# Patient Record
Sex: Female | Born: 2000 | Race: Black or African American | Hispanic: No | Marital: Single | Smoking: Never smoker
Health system: Southern US, Community
[De-identification: ages and names within clinical notes are randomized; demographics above are authoritative.]

## PROBLEM LIST (undated history)

## (undated) ENCOUNTER — Inpatient Hospital Stay (HOSPITAL_COMMUNITY): Payer: Self-pay

## (undated) DIAGNOSIS — Z789 Other specified health status: Secondary | ICD-10-CM

## (undated) DIAGNOSIS — O99019 Anemia complicating pregnancy, unspecified trimester: Secondary | ICD-10-CM

## (undated) HISTORY — PX: NO PAST SURGERIES: SHX2092

---
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
## 2021-01-29 LAB — OB RESULTS CONSOLE GC/CHLAMYDIA
Chlamydia: NEGATIVE
Neisseria Gonorrhea: NEGATIVE

## 2021-01-29 LAB — OB RESULTS CONSOLE HEPATITIS B SURFACE ANTIGEN: Hepatitis B Surface Ag: NEGATIVE

## 2021-01-29 LAB — OB RESULTS CONSOLE ABO/RH: RH Type: POSITIVE

## 2021-01-29 LAB — OB RESULTS CONSOLE VARICELLA ZOSTER ANTIBODY, IGG: Varicella: NON-IMMUNE/NOT IMMUNE

## 2021-01-29 LAB — OB RESULTS CONSOLE RUBELLA ANTIBODY, IGM: Rubella: IMMUNE

## 2021-01-29 LAB — OB RESULTS CONSOLE ANTIBODY SCREEN: Antibody Screen: NEGATIVE

## 2021-01-29 LAB — HEPATITIS C ANTIBODY: HCV Ab: NEGATIVE

## 2021-05-02 IMAGING — MR MR PELVIS W/O CM
11 of 13 series · 35 of 48 positions shown · non-contrast
Comparison: 03/09/2020

CLINICAL DATA: Right lower quadrant pain, third trimester pregnancy

EXAM:
MRI ABDOMEN AND PELVIS WITHOUT CONTRAST
TECHNIQUE: Multiplanar multisequence MR imaging of the abdomen and pelvis was
performed. No intravenous contrast was administered.

[Series 4: cor haste · coronal · 5.0mm · 1.25mm/px · 2 of 44 slices shown]
[im 1/44]
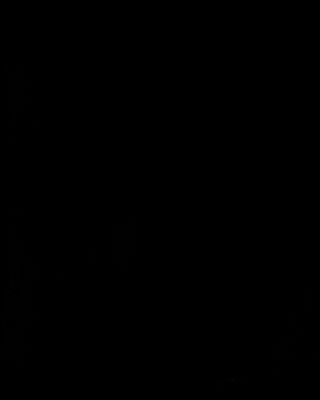
[im 44/44]
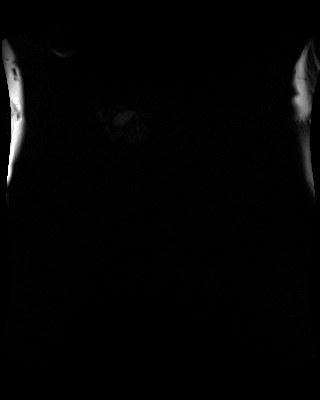

[Series 5: cor haste fs · coronal · 5.0mm · 1.25mm/px · 3 of 44 slices shown]
[im 1/44]
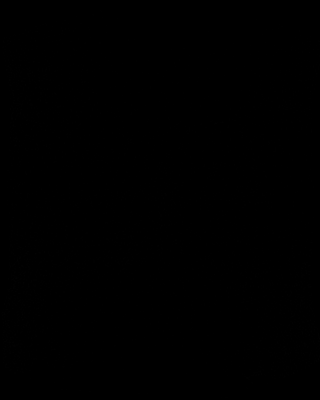
[im 22/44]
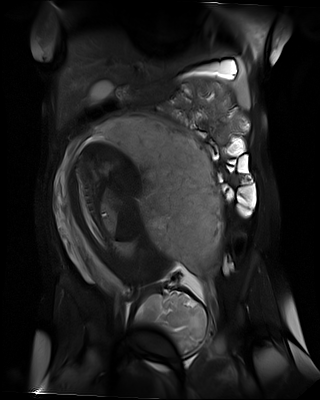
[im 44/44]
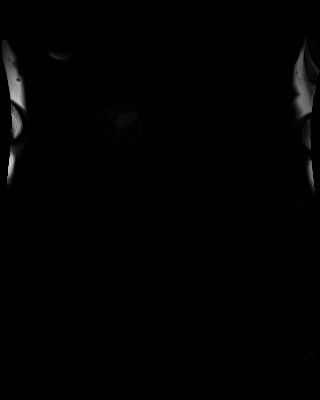

[Series 7: T2 · axial · 5.0mm · 1.03mm/px · z∈[-36,+240]mm · 3 of 47 slices shown (1 of 2)]
[im 1/47]
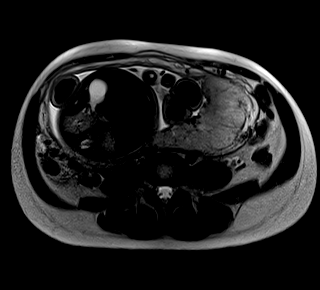
[im 24/47]
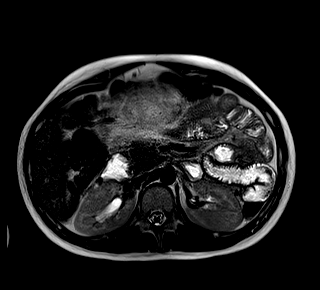
[im 47/47]
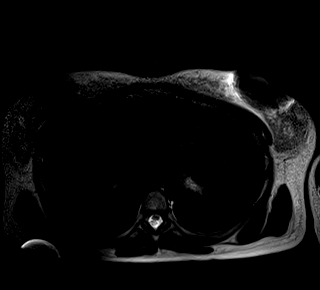

[Series 8: T2 fat-sat · axial · 5.0mm · 1.03mm/px · z∈[-36,+240]mm · 3 of 47 slices shown (1 of 2)]
[im 1/47]
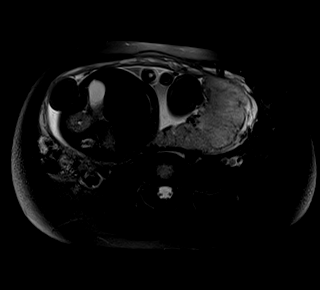
[im 24/47]
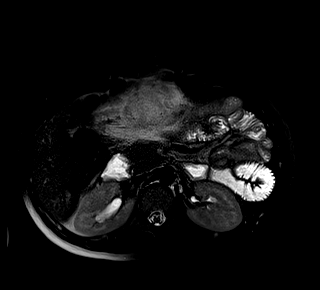
[im 47/47]
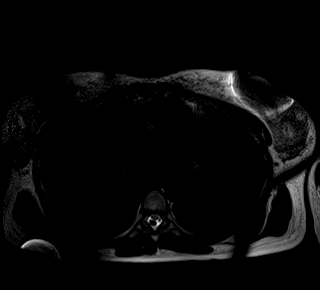

[Series 9: T2 · axial · 4.0mm · 1.00mm/px · z∈[-252,+16]mm · 3 of 57 slices shown (2 of 2)]
[im 1/57]
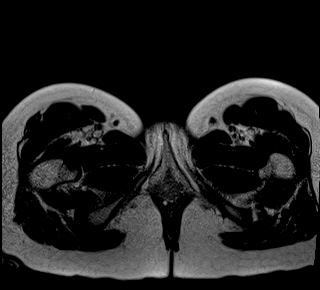
[im 29/57]
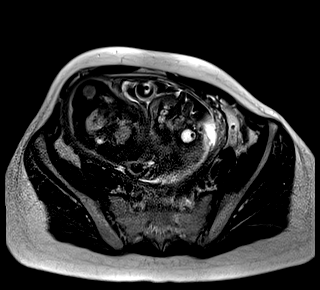
[im 57/57]
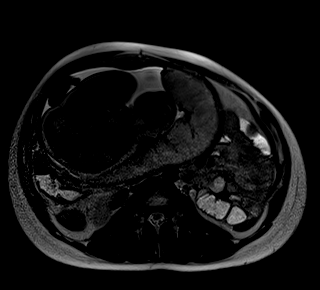

[Series 10: T2 fat-sat · axial · 4.0mm · 1.00mm/px · z∈[-252,+16]mm · 3 of 57 slices shown (2 of 2)]
[im 1/57]
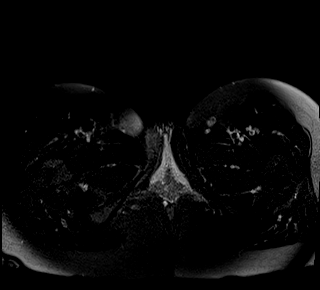
[im 29/57]
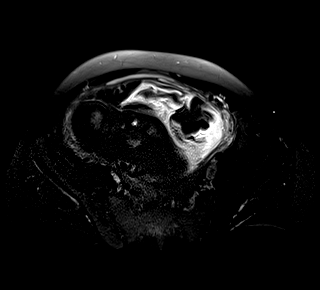
[im 57/57]
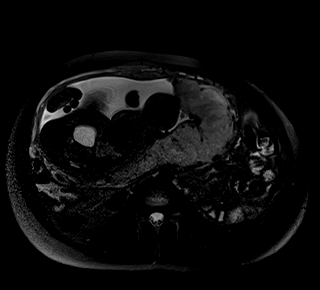

[Series 11: bSSFP · axial · 4.0mm · 0.62mm/px · z∈[-259,+9]mm · 3 of 55 slices shown (1 of 3)]
[im 1/55]
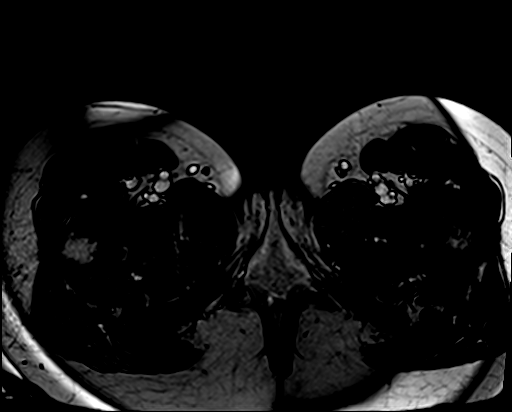
[im 28/55]
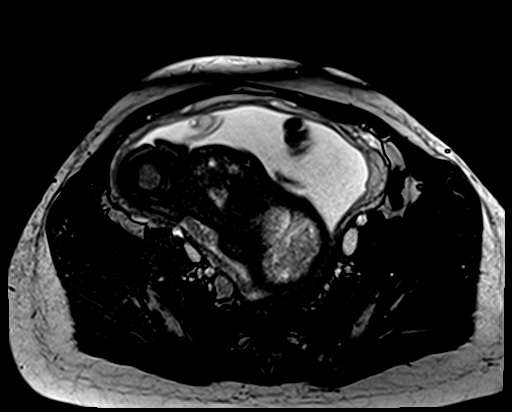
[im 55/55]
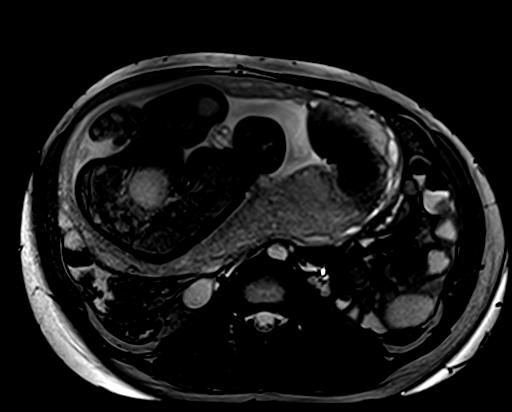

[Series 12: bSSFP · axial · 4.0mm · 0.62mm/px · z∈[-14,+245]mm · 3 of 55 slices shown (2 of 3)]
[im 1/55]
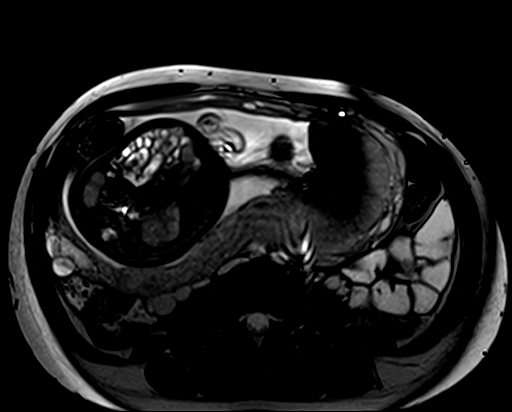
[im 28/55]
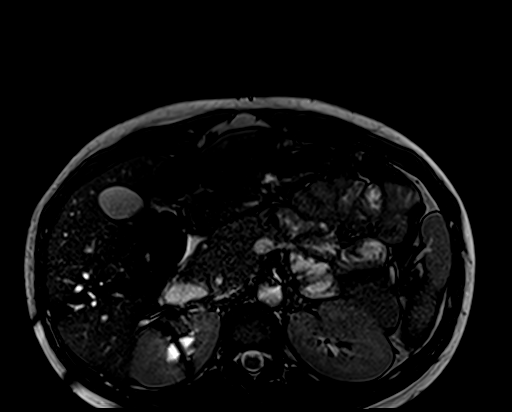
[im 55/55]
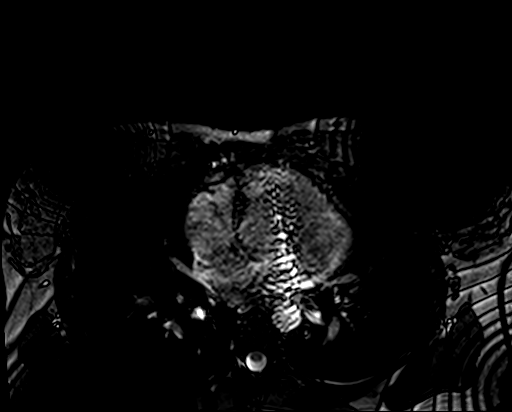

[Series 13: T1 dynamic · axial · 3.0mm · 1.19mm/px · z∈[+2,+263]mm · 5 of 88 slices shown (1 of 2)]
[im 1/88]
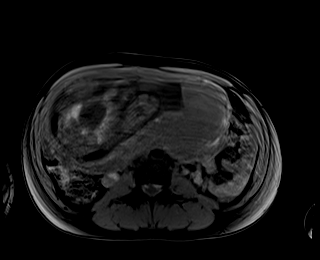
[im 22/88]
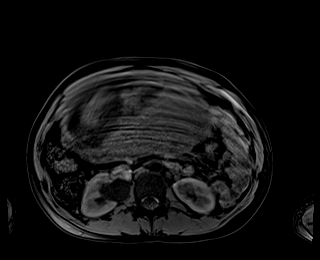
[im 44/88]
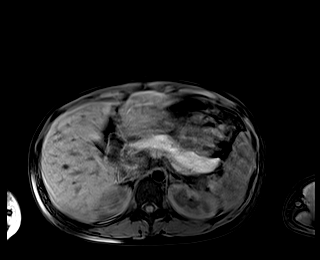
[im 66/88]
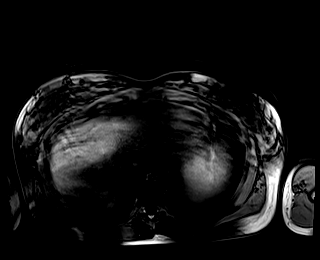
[im 88/88]
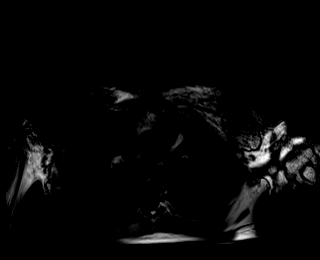

[Series 14: T1 dynamic · axial · 3.0mm · 1.19mm/px · z∈[-266,+18]mm · 6 of 96 slices shown (2 of 2)]
[im 1/96]
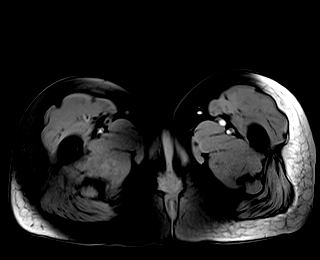
[im 20/96]
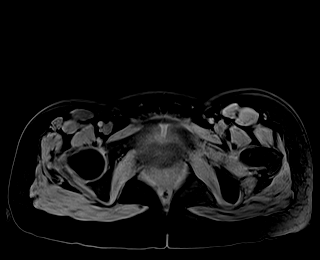
[im 39/96]
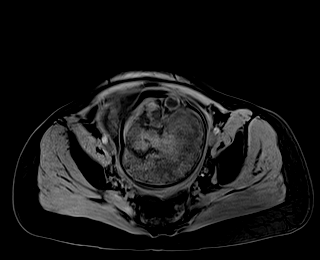
[im 58/96]
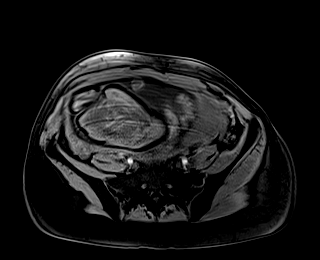
[im 77/96]
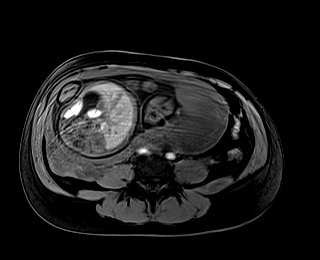
[im 96/96]
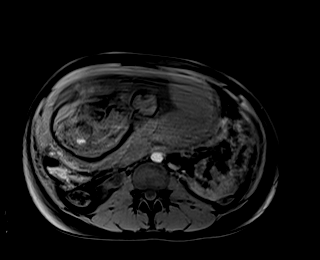

[Series 15: bSSFP · coronal · 5.0mm · 0.78mm/px · 1 of 44 slices shown (3 of 3)]
[im 1/44]
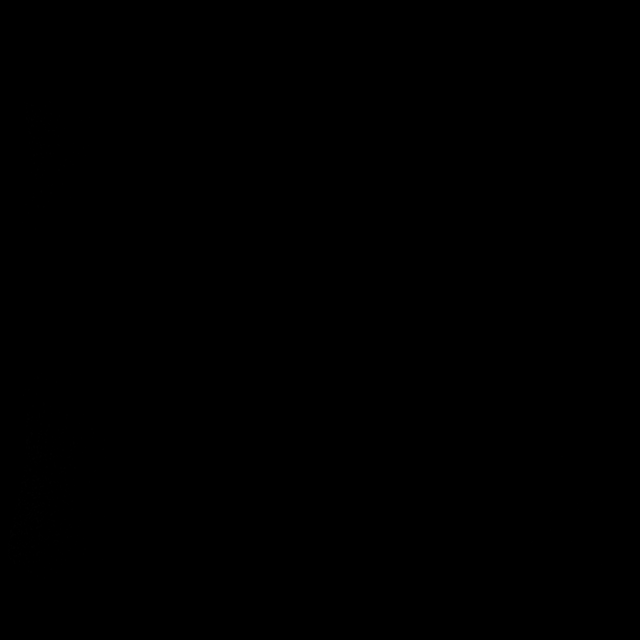

[35 of 48 positions shown; findings below may reference images not displayed]

FINDINGS: COMBINED FINDINGS FOR BOTH MR ABDOMEN AND PELVIS

Lower chest: No acute pleural or parenchymal lung disease.

Hepatobiliary: Unenhanced imaging of the liver demonstrates no gross
abnormalities. No biliary dilation. Gallbladder is unremarkable.

Pancreas: No mass, inflammatory changes, or other parenchymal
abnormality identified.

Spleen:  Within normal limits in size and appearance.

Adrenals/Urinary Tract: There is moderate right-sided
hydronephrosis, likely due to extrinsic compression of the mid right
ureter due to the gravid uterus. No evidence of urinary tract
calculus. Trace right perinephric fluid.

The left kidney is unremarkable. Bladder is decompressed. No adrenal
masses.

Stomach/Bowel: No bowel obstruction or ileus. The appendix is not
identified. There is no free fluid or inflammatory change within the
right lower quadrant however.

Vascular/Lymphatic: No pathologically enlarged lymph nodes
identified. No abdominal aortic aneurysm demonstrated.

Reproductive: Gravid uterus is identified. Single intrauterine
pregnancy is identified in cephalic presentation. This examination
was performed to evaluate maternal abdominal pain, and is
insufficient for evaluation of fetal anomalies. No adnexal masses.

Other: No free intraperitoneal fluid or free gas. No abdominal wall
hernia.

Musculoskeletal: No suspicious bone lesions identified.
IMPRESSION: 1. Moderate right-sided hydronephrosis likely due to extrinsic
compression of the mid right ureter from the gravid uterus. No
evidence of urinary tract calculus.
2. Nonvisualization of the appendix. No inflammatory changes within
the right hemiabdomen however.
3. Single intrauterine pregnancy in cephalic presentation.

## 2021-05-07 LAB — OB RESULTS CONSOLE RPR: RPR: NONREACTIVE

## 2021-05-07 LAB — OB RESULTS CONSOLE HIV ANTIBODY (ROUTINE TESTING): HIV: NONREACTIVE

## 2021-05-07 LAB — OB RESULTS CONSOLE GBS: GBS: NEGATIVE

## 2021-06-07 ENCOUNTER — Other Ambulatory Visit: Payer: Self-pay

## 2021-06-07 ENCOUNTER — Inpatient Hospital Stay (HOSPITAL_BASED_OUTPATIENT_CLINIC_OR_DEPARTMENT_OTHER): Payer: BC Managed Care – PPO

## 2021-06-07 ENCOUNTER — Encounter (HOSPITAL_COMMUNITY): Payer: Self-pay

## 2021-06-07 ENCOUNTER — Observation Stay (HOSPITAL_COMMUNITY)
Admission: EM | Admit: 2021-06-07 | Discharge: 2021-06-08 | Disposition: A | Discharge status: Home / Self Care | Payer: BC Managed Care – PPO | Attending: Obstetrics & Gynecology | Admitting: Obstetrics & Gynecology

## 2021-06-07 DIAGNOSIS — W109XXA Fall (on) (from) unspecified stairs and steps, initial encounter: Secondary | ICD-10-CM

## 2021-06-07 DIAGNOSIS — W108XXA Fall (on) (from) other stairs and steps, initial encounter: Secondary | ICD-10-CM | POA: Insufficient documentation

## 2021-06-07 DIAGNOSIS — S61412A Laceration without foreign body of left hand, initial encounter: Secondary | ICD-10-CM | POA: Diagnosis not present

## 2021-06-07 DIAGNOSIS — Z3A37 37 weeks gestation of pregnancy: Secondary | ICD-10-CM

## 2021-06-07 DIAGNOSIS — M545 Low back pain, unspecified: Secondary | ICD-10-CM | POA: Diagnosis not present

## 2021-06-07 DIAGNOSIS — Y9389 Activity, other specified: Secondary | ICD-10-CM | POA: Insufficient documentation

## 2021-06-07 DIAGNOSIS — O9A213 Injury, poisoning and certain other consequences of external causes complicating pregnancy, third trimester: Secondary | ICD-10-CM

## 2021-06-07 DIAGNOSIS — S301XXA Contusion of abdominal wall, initial encounter: Secondary | ICD-10-CM | POA: Insufficient documentation

## 2021-06-07 DIAGNOSIS — O26893 Other specified pregnancy related conditions, third trimester: Secondary | ICD-10-CM | POA: Diagnosis not present

## 2021-06-07 DIAGNOSIS — O9A219 Injury, poisoning and certain other consequences of external causes complicating pregnancy, unspecified trimester: Secondary | ICD-10-CM | POA: Diagnosis present

## 2021-06-07 DIAGNOSIS — R109 Unspecified abdominal pain: Secondary | ICD-10-CM

## 2021-06-07 DIAGNOSIS — R102 Pelvic and perineal pain: Secondary | ICD-10-CM | POA: Diagnosis not present

## 2021-06-07 DIAGNOSIS — W19XXXA Unspecified fall, initial encounter: Principal | ICD-10-CM

## 2021-06-07 LAB — CBC
HCT: 30.5 % — ABNORMAL LOW (ref 36.0–46.0)
Hemoglobin: 9.7 g/dL — ABNORMAL LOW (ref 12.0–15.0)
MCH: 25.9 pg — ABNORMAL LOW (ref 26.0–34.0)
MCHC: 31.8 g/dL (ref 30.0–36.0)
MCV: 81.6 fL (ref 80.0–100.0)
Platelets: 209 10*3/uL (ref 150–400)
RBC: 3.74 MIL/uL — ABNORMAL LOW (ref 3.87–5.11)
RDW: 16.8 % — ABNORMAL HIGH (ref 11.5–15.5)
WBC: 8.1 10*3/uL (ref 4.0–10.5)
nRBC: 0 % (ref 0.0–0.2)

## 2021-06-07 LAB — TYPE AND SCREEN
ABO/RH(D): O POS
Antibody Screen: NEGATIVE

## 2021-06-07 MED ORDER — PRENATAL MULTIVITAMIN CH
1.0000 | ORAL_TABLET | Freq: Every day | ORAL | Status: DC
  Administered 2021-06-08: 1 via ORAL
  Filled 2021-06-07: qty 1

## 2021-06-07 MED ORDER — LACTATED RINGERS IV SOLN: INTRAVENOUS | Status: DC

## 2021-06-07 MED ORDER — ACETAMINOPHEN 325 MG PO TABS: 650.0000 mg | ORAL_TABLET | ORAL | Status: DC | PRN

## 2021-06-07 MED ORDER — CALCIUM CARBONATE ANTACID 500 MG PO CHEW: 2.0000 | CHEWABLE_TABLET | ORAL | Status: DC | PRN

## 2021-06-07 MED ORDER — LACTATED RINGERS IV BOLUS
500.0000 mL | Freq: Once | INTRAVENOUS | Status: AC
  Administered 2021-06-07: 500 mL via INTRAVENOUS

## 2021-06-07 MED ORDER — DOCUSATE SODIUM 100 MG PO CAPS
100.0000 mg | ORAL_CAPSULE | Freq: Every day | ORAL | Status: DC
  Administered 2021-06-08: 100 mg via ORAL
  Filled 2021-06-07: qty 1

## 2021-06-07 MED ORDER — FENTANYL CITRATE (PF) 100 MCG/2ML IJ SOLN: 100.0000 ug | INTRAMUSCULAR | Status: DC | PRN

## 2021-06-07 NOTE — ED Notes (Signed)
Rapid OB called  

## 2021-06-07 NOTE — ED Triage Notes (Signed)
Pt reports falling down the stairs and landing on her bottom. Pt now endorses pelvic pain and pressure. Denies vaginal bleeding and N/V/D. Pt is [redacted] weeks pregnant.

## 2021-06-07 NOTE — ED Provider Notes (Signed)
[facility] Provider Note   CSN: [id number] Arrival date & time: 06/07/21  1835     History  Chief Complaint  Patient presents with   Regina Zhang is a 21 y.o. female.  HPI Patient presents after a fall.  Notably, the patient is [redacted] weeks pregnant, with unremarkable pregnancy thus far.  She is G3, P2.  Patient is initially alone, but is eventually joined by female companion. History is obtained by those 2. Patient notes that she slipped down a couple stairs, since that time has had pain in her mid lower back with bilateral lower abdominal pain as well.  She continues to feel fetal motion after a brief pause of not doing so.  No vaginal bleeding, discharge, no contractions.    Home Medications Prior to Admission medications   Medication Sig Start Date End Date Taking? Authorizing Provider  Prenatal Vit-Fe Fumarate-FA (MULTIVITAMIN-PRENATAL) 27-0.8 MG TABS tablet Take 1 tablet by mouth daily.    Yes [provider]  ondansetron (ZOFRAN ODT) 4 MG disintegrating tablet Take 1 tablet (4 mg total) by mouth every 8 (eight) hours as needed for nausea or vomiting. Patient not taking: Reported on 06/07/2021 03/10/20   Conard Novak, MD  promethazine (PHENERGAN) 25 MG tablet Take 1 tablet (25 mg total) by mouth every 6 (six) hours as needed for nausea or vomiting. Patient not taking: Reported on 06/07/2021 03/10/20   Conard Novak, MD      Allergies    Patient has no known allergies.    Review of Systems   Review of Systems  All other systems reviewed and are negative.  Physical Exam Updated Vital Signs BP (!) 104/44   Pulse 69   Temp 98.4 F (36.9 C) (Oral)   Resp 15   SpO2 100%  Physical Exam Vitals and nursing note reviewed.  Constitutional:      General: She is not in acute distress.    Appearance: She is well-developed.  HENT:     Head: Normocephalic and atraumatic.  Eyes:     Conjunctiva/sclera: Conjunctivae normal.   Cardiovascular:     Rate and Rhythm: Normal rate and regular rhythm.  Pulmonary:     Effort: Pulmonary effort is normal. No respiratory distress.     Breath sounds: Normal breath sounds. No stridor.  Abdominal:     General: There is no distension.     Tenderness: There is no abdominal tenderness. There is no guarding.     Comments: Gravid abdomen  Skin:    General: Skin is warm and dry.  Neurological:     General: No focal deficit present.     Mental Status: She is alert and oriented to person, place, and time.     Cranial Nerves: No cranial nerve deficit.     Motor: No weakness.  Psychiatric:        Mood and Affect: Mood normal.    ED Results / Procedures / Treatments   Labs (all labs ordered are listed, but only abnormal results are displayed) Labs Reviewed  CBC - Abnormal; Notable for the following components:      Result Value   RBC 3.74 (*)    Hemoglobin 9.7 (*)    HCT 30.5 (*)    MCH 25.9 (*)    RDW 16.8 (*)    All other components within normal limits  KLEIHAUER-BETKE STAIN  RPR  TYPE AND SCREEN    EKG None  Radiology  No results found.  Procedures Procedures    Medications Ordered in ED Medications  acetaminophen (TYLENOL) tablet 650 mg (has no administration in time range)  docusate sodium (COLACE) capsule 100 mg (has no administration in time range)  calcium carbonate (TUMS - dosed in mg elemental calcium) chewable tablet 400 mg of elemental calcium (has no administration in time range)  prenatal multivitamin tablet 1 tablet (has no administration in time range)  lactated ringers infusion ( Intravenous New Bag/Given 06/07/21 2159)  lactated ringers bolus 500 mL (500 mLs Intravenous Bolus from Bag 06/07/21 2210)    ED Course/ Medical Decision Making/ A&P This patient with a Hx of 2 prior pregnancies, now almost full-term presents to the ED for concern of abdominal pain, back pain following a fall, this involves an extensive number of treatment options,  and is a complaint that carries with it a high risk of complications and morbidity.    The differential diagnosis includes placental abruption, back injury,   Social Determinants of Health:  No limits  Additional history obtained:  Additional history and/or information obtained from female companion, notable for HPI   After the initial evaluation, orders, including: OB rapid response, continuous tocometry were initiated.   Patient placed on Cardiac and Pulse-Oximetry Monitors. The patient was maintained on a cardiac monitor.  The cardiac monitored showed an rhythm of 70 sinus normal The patient was also maintained on pulse oximetry. The readings were typically 100% room air normal  Tocometry heart rate 120s   On repeat evaluation of the patient stayed the same   Consultations Obtained:  I requested consultation with the OB rapid response,  and discussed lab and imaging findings as well as pertinent plan - they recommend: Patient be transferred to our MAU/OB unit for continuous monitoring.  Dispostion / Final MDM:  After consideration of the diagnostic results and the patient's response to treatment, this adult female, G3, P1, at 37 weeks presents after a fall, has abdominal pain, back pain.  No vaginal bleeding, discharge, drainage is somewhat reassuring, but given her abdominal pain, and contractions visible after she was placed on continuous monitoring here she was transferred, after consultation to our MAU.  Final Clinical Impression(s) / ED Diagnoses Final diagnoses:  Fall, initial encounter  Abdominal pain during pregnancy in third trimester    Rx / DC Orders ED Discharge Orders          Ordered    OB RESULT CONSOLE Group B Strep       Comments: This external order was created through the Results Console.   06/07/21 2246    OB RESULTS CONSOLE RPR       Comments: This external order was created through the Results Console.    06/07/21 2246    OB RESULTS CONSOLE  HIV antibody       Comments: This external order was created through the Results Console.    06/07/21 2246    OB RESULTS CONSOLE Rubella Antibody       Comments: This external order was created through the Results Console.    06/07/21 2246    OB RESULTS CONSOLE Varicella zoster antibody, IgG       Comments: This external order was created through the Results Console.    06/07/21 2246    OB RESULTS CONSOLE Hepatitis B surface antigen       Comments: This external order was created through the Results Console.    06/07/21 2246    OB RESULTS CONSOLE ABO/Rh  Comments: This external order was created through the Results Console.    06/07/21 2246    OB RESULTS CONSOLE Antibody Screen       Comments: This external order was created through the Results Console.    06/07/21 2246    Hepatitis C antibody       Comments: This external order was created through the Results Console.    06/07/21 2246    OB RESULTS CONSOLE GC/Chlamydia       Comments: This external order was created through the Results Console.   06/07/21 2252              Gerhard Munch, MD 06/07/21 2357

## 2021-06-07 NOTE — ED Notes (Signed)
Pt placed on baby monitor. Babies heart beat located and consistant at 160. Baby is now moving around

## 2021-06-07 NOTE — ED Notes (Signed)
[facility] called for transport. 

## 2021-06-07 NOTE — H&P (Cosign Needed Addendum)
Regina Zhang is a 21 y.o. female presenting for injuries sustained after a fall onto concrete stairs today.  Patient states she was chasing her small child and fell onto stairs.  ED Triage note states pt had fallen and landed on her bottom.  She tells me she landed on her abdomen.   Had a bleeding laceration on left hand and ecchymosis on abdomen with tenderness.  Gets prenatal care at [facility]   Moved from [place] to [place] but sees OB in [facility].   History is remarkable for SVD in 2021 and 2022 at [facility].  Pregnancies were uneventful  Of note, she did have a fall in March of 2022 at 37 weels/ There was a Nurses note which mentioned that when told the FOB needed to take their child to waiting room due to covid, the FOB would not make eye contact with patient who then decided suddenly to leave AMA    OB History     Gravida  3   Para  2   Term  2   Preterm      AB      Living  2      SAB      IAB      Ectopic      Multiple  0   Live Births  2          Past Medical History:  Diagnosis Date   Medical history non-contributory    No known health problems    Past Surgical History:  Procedure Laterality Date   NO PAST SURGERIES     Family History: Family history is unknown by patient. Social History:  reports that she has never smoked. She has never used smokeless tobacco. She reports that she does not drink alcohol and does not use drugs.     Maternal Diabetes: No Genetic Screening: Normal Maternal Ultrasounds/Referrals: Isolated choroid plexus cyst and Fetal renal pyelectasis Fetal Ultrasounds or other Referrals:  None Maternal Substance Abuse:  No Significant Maternal Medications:  None Significant Maternal Lab Results:  Group B Strep negative Other Comments:  None  Review of Systems  Constitutional:  Negative for chills and fever.  Eyes:  Negative for visual disturbance.  Respiratory:  Negative for shortness of breath.   Gastrointestinal:  Positive  for abdominal pain.  Genitourinary:  Negative for frequency and vaginal bleeding.  Neurological:  Negative for dizziness and weakness.  Maternal Medical History:  Reason for admission: Contractions.   Contractions: Onset was 3-5 hours ago.   Frequency: irregular.   Perceived severity is mild.   Fetal activity: Perceived fetal activity is normal.   Last perceived fetal movement was within the past hour.   Prenatal complications: No bleeding, PIH, infection, placental abnormality, pre-eclampsia or preterm labor.   Prenatal Complications - Diabetes: none.  Dilation: 1.5 Effacement (%): 50 Station: -2 Exam by:: Photographer Blood pressure 101/66, pulse 95, temperature 98.4 F (36.9 C), temperature source Oral, resp. rate 15, SpO2 100 %, unknown if currently breastfeeding. Maternal Exam:  Uterine Assessment: Contraction strength is mild.  Contraction frequency is irregular.  Abdomen: Fetal presentation: vertex Ecchymosis to abdomen with several abrasions (concrete stairs).  Tender over areas of ecchymosis Introitus: Normal vulva. Normal vagina.  Ferning test: not done.  Nitrazine test: not done. Pelvis: adequate for delivery.   Cervix: Cervix evaluated by digital exam.     Fetal Exam Fetal Monitor Review: Mode: ultrasound.   Baseline rate: 120s then 150s baseline change.  Variability: moderate (6-25 bpm).   Pattern: accelerations present and variable decelerations.   Fetal State Assessment: Category I - tracings are normal.  Physical Exam Constitutional:      General: She is not in acute distress.    Appearance: She is not ill-appearing or toxic-appearing.  HENT:     Head: Normocephalic.  Cardiovascular:     Rate and Rhythm: Normal rate.  Pulmonary:     Effort: Pulmonary effort is normal.  Abdominal:     General: There is no distension.     Tenderness: There is abdominal tenderness. There is no rebound.     Comments: Ecchymosis and abrasions  Genitourinary:     General: Normal vulva.     Comments: Dilation: 1.5 Effacement (%): 50 Station: -2 Presentation: Vertex Exam by:: Wendi Maya RN  Musculoskeletal:        General: Normal range of motion.     Cervical back: Normal range of motion.  Skin:    General: Skin is warm and dry.     Comments: Abrasion on left hand, dressed by RN  Neurological:     General: No focal deficit present.     Mental Status: She is alert.  Psychiatric:        Mood and Affect: Mood normal.    Prenatal labs: ABO, Rh:   Antibody:   Rubella:   RPR:    HBsAg:    HIV:    GBS:     Assessment/Plan: Single IUP at [redacted]w[redacted]d Status post fall onto abdomen  Abdominal ecchymosis and abrasions Fetal heart rate pattern reassuring with occasional variable deceleration Uterine contractions, question labor   Admit to Labor and Delivery  Watch for labor, if contractions slow down, transfer to [employer] 06/07/2021, 10:07 PM

## 2021-06-07 NOTE — ED Notes (Signed)
Fetal monitor in place, FHR 155 at this time.

## 2021-06-08 DIAGNOSIS — O9A213 Injury, poisoning and certain other consequences of external causes complicating pregnancy, third trimester: Secondary | ICD-10-CM | POA: Diagnosis not present

## 2021-06-08 LAB — KLEIHAUER-BETKE STAIN
# Vials RhIg: 1
Fetal Cells %: 0 %
Quantitation Fetal Hemoglobin: 0 mL

## 2021-06-08 LAB — RPR: RPR Ser Ql: NONREACTIVE

## 2021-06-08 MED ORDER — FERROUS SULFATE 325 (65 FE) MG PO TABS
325.0000 mg | ORAL_TABLET | ORAL | Status: DC
  Administered 2021-06-08: 325 mg via ORAL
  Filled 2021-06-08: qty 1

## 2021-06-08 MED ORDER — OXYCODONE-ACETAMINOPHEN 5-325 MG PO TABS
1.0000 | ORAL_TABLET | Freq: Four times a day (QID) | ORAL | Status: DC | PRN
  Administered 2021-06-08: 1 via ORAL
  Filled 2021-06-08: qty 1

## 2021-06-08 NOTE — Progress Notes (Signed)
Late Entry:  RROB RN arrived to patients room at 1910. Patient is on [place] and [employer] monitors. Patient verbally denies bleeding or leaking of fluid. Patient has visual abrasions and bruising above and below belly button and on right side of abdomen that is soft but tender to palpation. Patient verbally denies feeling any contractions, headache, blurry vision.   RROB RN contacted OB attending and received orders to direct admit patient to [facility] for 24 hrs observation.   Just before transport arrived, patient began to verbalize that she can now feel contractions and they are uncomfortable. Patient had reported earlier a sharp pain on right side of abdomen. On the way to [facility], this RROB contacted OB attending with updated information. Received verbal order to have patient admitted to L&D. Updated transport and patient was brought to L&D. RROB gave report to charge nurse.   Lovenia Shuck, RN RROB

## 2021-06-08 NOTE — Progress Notes (Signed)
Pt states she has not had any further abdominal pain. No vaginal bleeding.  +FM.  She is feeling well and ready to go home.  She has an OB appointment scheduled for next week.  Reviewed precautions and conservative management for her back pain.  Questions and concerns were addressed.  Stable for discharge  Janyth Pupa, DO Attending [place], [facility] for [facility] [place]

## 2021-06-08 NOTE — Progress Notes (Addendum)
Patient ID: Regina Zhang, female   DOB: 2000-03-16, 21 y.o.   MRN: [id number] Doing well  UCs somewhat painful  Vitals:   06/07/21 2102 06/07/21 2145 06/07/21 2225 06/08/21 0223  BP: 101/66  (!) 104/44 (!) 97/44  Pulse: 95  69 80  Resp: 15     Temp:  98.4 F (36.9 C)    TempSrc: Oral Oral    SpO2:       US/BPP:   No sign of abruption noted BPP 8/8 AFI 27%ile  FHR reassuring UCs irregular with some irrtiabiltiy  Last cx exam:  Dilation: 2.5 Effacement (%): 50 Station: -2 Presentation: Vertex Exam by:: Keith Rake RN

## 2021-06-08 NOTE — Progress Notes (Addendum)
Patient ID: Regina Zhang, female   DOB: [birth year], 21 y.o.   MRN: [id number] Vitals:   06/07/21 2145 06/07/21 2225 06/08/21 0223 06/08/21 0606  BP:  (!) 104/44 (!) 97/44 (!) 103/46  Pulse:  69 80 74  Resp:   15   Temp: 98.4 F (36.9 C)  98.4 F (36.9 C)   TempSrc: Oral  Oral   SpO2:       FHR remains reassuring with good variability Uterine irritability noted  Cervical exam deferred  Has some sharp pain in right lower back, will treat with Percocet.  Will  transfer to [facility]
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
## 2021-06-08 NOTE — Progress Notes (Signed)
Pt discharged after d/c instructions given. Pt verbalized understanding and all questions answered. Pt in stable condition at discharge with no new complaints. Pt sent home with all belongings.

## 2021-06-08 NOTE — Progress Notes (Signed)
Patient ID: Regina Zhang, female   DOB: 07/01/2000, 21 y.o.   MRN: [id number] Requesting pain medicine  Uterine contractions becoming more painful  Vitals:   06/07/21 2035 06/07/21 2102 06/07/21 2145 06/07/21 2225  BP:  101/66  (!) 104/44  Pulse: 83 95  69  Resp:  15    Temp:   98.4 F (36.9 C)   TempSrc:  Oral Oral   SpO2: 100%      FHR reassuring UCs irregular, q4-38min  Last cervical exam: Dilation: 2.5 Effacement (%): 50 Station: -2 Presentation: Vertex Exam by:: Valarie Merino RN

## 2021-07-02 ENCOUNTER — Emergency Department (HOSPITAL_COMMUNITY): Payer: BC Managed Care – PPO

## 2021-07-02 ENCOUNTER — Encounter (HOSPITAL_COMMUNITY): Payer: Self-pay

## 2021-07-02 ENCOUNTER — Emergency Department (HOSPITAL_COMMUNITY)
Admission: EM | Admit: 2021-07-02 | Discharge: 2021-07-02 | Disposition: A | Discharge status: Home / Self Care | Payer: BC Managed Care – PPO | Attending: Emergency Medicine | Admitting: Emergency Medicine

## 2021-07-02 ENCOUNTER — Other Ambulatory Visit: Payer: Self-pay

## 2021-07-02 ENCOUNTER — Encounter (HOSPITAL_COMMUNITY): Payer: Self-pay | Admitting: Radiology

## 2021-07-02 DIAGNOSIS — R112 Nausea with vomiting, unspecified: Secondary | ICD-10-CM | POA: Insufficient documentation

## 2021-07-02 DIAGNOSIS — R1032 Left lower quadrant pain: Secondary | ICD-10-CM | POA: Diagnosis not present

## 2021-07-02 DIAGNOSIS — N939 Abnormal uterine and vaginal bleeding, unspecified: Secondary | ICD-10-CM | POA: Insufficient documentation

## 2021-07-02 LAB — COMPREHENSIVE METABOLIC PANEL
ALT: 10 U/L (ref 0–44)
AST: 17 U/L (ref 15–41)
Albumin: 3 g/dL — ABNORMAL LOW (ref 3.5–5.0)
Alkaline Phosphatase: 128 U/L — ABNORMAL HIGH (ref 38–126)
Anion gap: 8 (ref 5–15)
BUN: 14 mg/dL (ref 6–20)
CO2: 21 mmol/L — ABNORMAL LOW (ref 22–32)
Calcium: 8.8 mg/dL — ABNORMAL LOW (ref 8.9–10.3)
Chloride: 110 mmol/L (ref 98–111)
Creatinine, Ser: 0.62 mg/dL (ref 0.44–1.00)
GFR, Estimated: >60 mL/min (ref >60)
Glucose, Bld: 86 mg/dL (ref 70–99)
Potassium: 4 mmol/L (ref 3.5–5.1)
Sodium: 139 mmol/L (ref 135–145)
Total Bilirubin: 0.4 mg/dL (ref 0.3–1.2)
Total Protein: 6.8 g/dL (ref 6.5–8.1)

## 2021-07-02 LAB — CBC
HCT: 34 % — ABNORMAL LOW (ref 36.0–46.0)
Hemoglobin: 10.5 g/dL — ABNORMAL LOW (ref 12.0–15.0)
MCH: 25.5 pg — ABNORMAL LOW (ref 26.0–34.0)
MCHC: 30.9 g/dL (ref 30.0–36.0)
MCV: 82.7 fL (ref 80.0–100.0)
Platelets: 270 10*3/uL (ref 150–400)
RBC: 4.11 MIL/uL (ref 3.87–5.11)
RDW: 18.3 % — ABNORMAL HIGH (ref 11.5–15.5)
WBC: 5.6 10*3/uL (ref 4.0–10.5)
nRBC: 0 % (ref 0.0–0.2)

## 2021-07-02 LAB — URINALYSIS, ROUTINE W REFLEX MICROSCOPIC
Bacteria, UA: NONE SEEN
Bilirubin Urine: NEGATIVE
Glucose, UA: NEGATIVE mg/dL
Hgb urine dipstick: NEGATIVE
Ketones, ur: NEGATIVE mg/dL
Leukocytes,Ua: NEGATIVE
Nitrite: NEGATIVE
Protein, ur: NEGATIVE mg/dL
Specific Gravity, Urine: 1.043 — ABNORMAL HIGH (ref 1.005–1.030)
pH: 6 (ref 5.0–8.0)

## 2021-07-02 LAB — LIPASE, BLOOD: Lipase: 33 U/L (ref 11–51)

## 2021-07-02 MED ORDER — AMOXICILLIN-POT CLAVULANATE 875-125 MG PO TABS: 1.0000 | ORAL_TABLET | Freq: Two times a day (BID) | ORAL | 0 refills | Status: DC

## 2021-07-02 MED ORDER — ONDANSETRON HCL 4 MG/2ML IJ SOLN
4.0000 mg | Freq: Once | INTRAMUSCULAR | Status: AC
  Administered 2021-07-02: 4 mg via INTRAVENOUS
  Filled 2021-07-02: qty 2

## 2021-07-02 MED ORDER — SODIUM CHLORIDE 0.9 % IV BOLUS
1000.0000 mL | Freq: Once | INTRAVENOUS | Status: AC
  Administered 2021-07-02: 1000 mL via INTRAVENOUS

## 2021-07-02 MED ORDER — IOHEXOL 300 MG/ML  SOLN
100.0000 mL | Freq: Once | INTRAMUSCULAR | Status: AC | PRN
  Administered 2021-07-02: 100 mL via INTRAVENOUS

## 2021-07-02 MED ORDER — SODIUM CHLORIDE (PF) 0.9 % IJ SOLN
INTRAMUSCULAR | Status: AC
  Filled 2021-07-02: qty 50

## 2021-07-02 MED ORDER — FENTANYL CITRATE PF 50 MCG/ML IJ SOSY
50.0000 ug | PREFILLED_SYRINGE | Freq: Once | INTRAMUSCULAR | Status: AC
  Administered 2021-07-02: 50 ug via INTRAVENOUS
  Filled 2021-07-02: qty 1

## 2021-07-02 NOTE — ED Notes (Signed)
Called lab to check on urine result. States they are working on it

## 2021-07-02 NOTE — ED Provider Notes (Signed)
[facility]-EMERGENCY DEPT Provider Note   CSN: [id number] Arrival date & time: 07/02/21  0830     History  Chief Complaint  Patient presents with   Abdominal Pain   Emesis    Regina Zhang is a 21 y.o. female.  She has no significant past medical history.  Complaining of acute onset of left lower quadrant abdominal pain that started around 2 AM this morning.  Vomited once.  She had a vaginal delivery under epidural 6 days ago without complications at [facility].  She is breast-feeding along with formula and still passing some dark red lochia.  No fevers.  Rates the pain a 7 out of 10 at rest and 10 out of 10 with palpation or movement.  She said she has had ongoing back pain since the epidural.  No change.  The history is provided by the patient.  Abdominal Pain Pain location:  LLQ Pain quality: aching   Pain severity:  Severe Onset quality:  Sudden Duration:  7 hours Timing:  Constant Progression:  Unchanged Chronicity:  New Relieved by:  None tried Worsened by:  Movement and palpation Ineffective treatments:  None tried Associated symptoms: nausea, vaginal bleeding and vomiting   Associated symptoms: no chest pain, no cough, no diarrhea, no dysuria, no fever, no hematemesis, no hematochezia, no hematuria, no shortness of breath and no sore throat   Emesis Associated symptoms: abdominal pain   Associated symptoms: no cough, no diarrhea, no fever, no headaches and no sore throat        Home Medications Prior to Admission medications   Medication Sig Start Date End Date Taking? Authorizing Provider  ferrous sulfate 325 (65 FE) MG tablet Take 325 mg by mouth every other day.    [provider]  ondansetron (ZOFRAN ODT) 4 MG disintegrating tablet Take 1 tablet (4 mg total) by mouth every 8 (eight) hours as needed for nausea or vomiting. Patient not taking: Reported on 06/07/2021 03/10/20   Conard Novak, MD  Prenatal Vit-Fe Fumarate-FA  (MULTIVITAMIN-PRENATAL) 27-0.8 MG TABS tablet Take 1 tablet by mouth daily.     [provider]  promethazine (PHENERGAN) 25 MG tablet Take 1 tablet (25 mg total) by mouth every 6 (six) hours as needed for nausea or vomiting. Patient not taking: Reported on 06/07/2021 03/10/20   Conard Novak, MD      Allergies    Patient has no known allergies.    Review of Systems   Review of Systems  Constitutional:  Negative for fever.  HENT:  Negative for sore throat.   Eyes:  Negative for visual disturbance.  Respiratory:  Negative for cough and shortness of breath.   Cardiovascular:  Negative for chest pain.  Gastrointestinal:  Positive for abdominal pain, nausea and vomiting. Negative for diarrhea, hematemesis and hematochezia.  Genitourinary:  Positive for vaginal bleeding. Negative for dysuria and hematuria.  Musculoskeletal:  Positive for back pain.  Skin:  Negative for rash.  Neurological:  Negative for headaches.    Physical Exam Updated Vital Signs BP (!) 121/58 (BP Location: Left Arm)   Pulse 75   Temp 98.2 F (36.8 C) (Oral)   Resp 16   Ht 5\' 3"  (1.6 m)   Wt 65.8 kg   SpO2 96%   Breastfeeding Yes   BMI 25.69 kg/m  Physical Exam Vitals and nursing note reviewed.  Constitutional:      General: She is not in acute distress.    Appearance: Normal appearance.  She is well-developed.  HENT:     Head: Normocephalic and atraumatic.  Eyes:     Conjunctiva/sclera: Conjunctivae normal.  Cardiovascular:     Rate and Rhythm: Normal rate and regular rhythm.     Heart sounds: No murmur heard. Pulmonary:     Effort: Pulmonary effort is normal. No respiratory distress.     Breath sounds: Normal breath sounds.  Abdominal:     Palpations: Abdomen is soft.     Tenderness: There is abdominal tenderness in the left lower quadrant. There is no guarding or rebound.  Musculoskeletal:        General: No swelling.     Cervical back: Neck supple.  Skin:    General: Skin is warm  and dry.     Capillary Refill: Capillary refill takes less than 2 seconds.  Neurological:     General: No focal deficit present.     Mental Status: She is alert.     Gait: Gait normal.     ED Results / Procedures / Treatments   Labs (all labs ordered are listed, but only abnormal results are displayed) Labs Reviewed  COMPREHENSIVE METABOLIC PANEL - Abnormal; Notable for the following components:      Result Value   CO2 21 (*)    Calcium 8.8 (*)    Albumin 3.0 (*)    Alkaline Phosphatase 128 (*)    All other components within normal limits  CBC - Abnormal; Notable for the following components:   Hemoglobin 10.5 (*)    HCT 34.0 (*)    MCH 25.5 (*)    RDW 18.3 (*)    All other components within normal limits  URINALYSIS, ROUTINE W REFLEX MICROSCOPIC - Abnormal; Notable for the following components:   Color, Urine STRAW (*)    Specific Gravity, Urine 1.043 (*)    All other components within normal limits  LIPASE, BLOOD    EKG None  Radiology CT Abdomen Pelvis W Contrast  Result Date: 07/02/2021 CLINICAL DATA:  Left lower quadrant pain and vomiting. Vaginal delivery 6 days ago. EXAM: CT ABDOMEN AND PELVIS WITH CONTRAST TECHNIQUE: Multidetector CT imaging of the abdomen and pelvis was performed using the standard protocol following bolus administration of intravenous contrast. RADIATION DOSE REDUCTION: This exam was performed according to the departmental dose-optimization program which includes automated exposure control, adjustment of the mA and/or kV according to patient size and/or use of iterative reconstruction technique. CONTRAST:  OMNIPAQUE IOHEXOL 300 MG/ML  SOLN COMPARISON:  Pelvic ultrasound 07/02/2021. FINDINGS: Lower chest: Clear lung bases. Hepatobiliary: No focal liver abnormality is seen. No gallstones, gallbladder wall thickening, or biliary dilatation. Pancreas: Unremarkable. Spleen: Unremarkable. Adrenals/Urinary Tract: Unremarkable adrenal glands. No  evidence of a renal mass, calculi, or hydronephrosis. Unremarkable bladder. Stomach/Bowel: The stomach is unremarkable. A moderate amount of stool is present in the ascending and transverse colon. There is diastasis recti with protrusion of a few small bowel loops into the umbilical region but no discrete hernia. There is no evidence of bowel obstruction. The appendix is not clearly identified, however no pericecal inflammation is evident. Vascular/Lymphatic: No significant vascular findings are present. No enlarged abdominal or pelvic lymph nodes. Reproductive: Enlarged uterus consistent with recent postpartum state with endometrial thickening more fully evaluated on today's pelvic ultrasound. No adnexal mass. Other: No significant intraperitoneal free fluid. No pneumoperitoneum. Musculoskeletal: No acute osseous abnormality or suspicious osseous lesion. IMPRESSION: 1. No acute abnormality identified in the abdomen or pelvis. 2. Postpartum uterus with endometrial thickening  more fully evaluated on today's pelvic ultrasound. Electronically Signed   By: Sebastian Ache M.D.   On: 07/02/2021 11:02   US Pelvis Complete  Result Date: 07/02/2021 CLINICAL DATA:  Pelvic pain, 6 days post delivery in a 21 year old female. EXAM: TRANSABDOMINAL AND TRANSVAGINAL ULTRASOUND OF PELVIS DOPPLER ULTRASOUND OF OVARIES TECHNIQUE: Both transabdominal and transvaginal ultrasound examinations of the pelvis were performed. Transabdominal technique was performed for global imaging of the pelvis including uterus, ovaries, adnexal regions, and pelvic cul-de-sac. It was necessary to proceed with endovaginal exam following the transabdominal exam to visualize the endometrium, LEFT and RIGHT ovary and adnexa. Color and duplex Doppler ultrasound was utilized to evaluate blood flow to the ovaries. COMPARISON:  Prior MRI of the pelvis from March 09, 2021. FINDINGS: Uterus Measurements: 12.5 x 8.2 x 9.7 cm = volume: 516.4 mL. Mildly heterogeneous  appearance of the postpartum uterus. No focal lesion. Endometrium Thickness: 22 mm. Mildly irregular and heterogeneous appearance of the endometrium which is thickened. Mildly heterogeneous fluid in the endometrial canal. Right ovary Not visualized. Left ovary Measurements: 3.4 x 1.6 x 1.4 cm = volume: 4.1 mL. Normal appearance/no adnexal mass. Pulsed Doppler evaluation of the LEFT ovary demonstrates normal low resistance arterial and venous waveforms. Other findings No abnormal free fluid. IMPRESSION: Thickened endometrium in the postpartum state could be related to blood products. No gross flow is present within this area to indicate definitive signs of retained products of conception. However, given the thickened appearance this is not entirely excluded. Would correlate with vaginal bleeding or any other symptoms and consider close follow-up with imaging or clinical follow-up as warranted. Expected enlarged appearance of post partum uterus. Normal appearance of the LEFT ovary showing low resistance vascular flow. RIGHT ovary not visualized. Electronically Signed   By: Donzetta Kohut M.D.   On: 07/02/2021 10:08   US Transvaginal Non-OB  Result Date: 07/02/2021 CLINICAL DATA:  Pelvic pain, 6 days post delivery in a 21 year old female. EXAM: TRANSABDOMINAL AND TRANSVAGINAL ULTRASOUND OF PELVIS DOPPLER ULTRASOUND OF OVARIES TECHNIQUE: Both transabdominal and transvaginal ultrasound examinations of the pelvis were performed. Transabdominal technique was performed for global imaging of the pelvis including uterus, ovaries, adnexal regions, and pelvic cul-de-sac. It was necessary to proceed with endovaginal exam following the transabdominal exam to visualize the endometrium, LEFT and RIGHT ovary and adnexa. Color and duplex Doppler ultrasound was utilized to evaluate blood flow to the ovaries. COMPARISON:  Prior MRI of the pelvis from March 09, 2021. FINDINGS: Uterus Measurements: 12.5 x 8.2 x 9.7 cm = volume: 516.4  mL. Mildly heterogeneous appearance of the postpartum uterus. No focal lesion. Endometrium Thickness: 22 mm. Mildly irregular and heterogeneous appearance of the endometrium which is thickened. Mildly heterogeneous fluid in the endometrial canal. Right ovary Not visualized. Left ovary Measurements: 3.4 x 1.6 x 1.4 cm = volume: 4.1 mL. Normal appearance/no adnexal mass. Pulsed Doppler evaluation of the LEFT ovary demonstrates normal low resistance arterial and venous waveforms. Other findings No abnormal free fluid. IMPRESSION: Thickened endometrium in the postpartum state could be related to blood products. No gross flow is present within this area to indicate definitive signs of retained products of conception. However, given the thickened appearance this is not entirely excluded. Would correlate with vaginal bleeding or any other symptoms and consider close follow-up with imaging or clinical follow-up as warranted. Expected enlarged appearance of post partum uterus. Normal appearance of the LEFT ovary showing low resistance vascular flow. RIGHT ovary not visualized. Electronically Signed   By:  Donzetta Kohut M.D.   On: 07/02/2021 10:08   [place] Art/Ven Flow Abd Pelv Doppler  Result Date: 07/02/2021 CLINICAL DATA:  Pelvic pain, 6 days post delivery in a 21 year old female. EXAM: TRANSABDOMINAL AND TRANSVAGINAL ULTRASOUND OF PELVIS DOPPLER ULTRASOUND OF OVARIES TECHNIQUE: Both transabdominal and transvaginal ultrasound examinations of the pelvis were performed. Transabdominal technique was performed for global imaging of the pelvis including uterus, ovaries, adnexal regions, and pelvic cul-de-sac. It was necessary to proceed with endovaginal exam following the transabdominal exam to visualize the endometrium, LEFT and RIGHT ovary and adnexa. Color and duplex Doppler ultrasound was utilized to evaluate blood flow to the ovaries. COMPARISON:  Prior MRI of the pelvis from March 09, 2021. FINDINGS: Uterus Measurements:  12.5 x 8.2 x 9.7 cm = volume: 516.4 mL. Mildly heterogeneous appearance of the postpartum uterus. No focal lesion. Endometrium Thickness: 22 mm. Mildly irregular and heterogeneous appearance of the endometrium which is thickened. Mildly heterogeneous fluid in the endometrial canal. Right ovary Not visualized. Left ovary Measurements: 3.4 x 1.6 x 1.4 cm = volume: 4.1 mL. Normal appearance/no adnexal mass. Pulsed Doppler evaluation of the LEFT ovary demonstrates normal low resistance arterial and venous waveforms. Other findings No abnormal free fluid. IMPRESSION: Thickened endometrium in the postpartum state could be related to blood products. No gross flow is present within this area to indicate definitive signs of retained products of conception. However, given the thickened appearance this is not entirely excluded. Would correlate with vaginal bleeding or any other symptoms and consider close follow-up with imaging or clinical follow-up as warranted. Expected enlarged appearance of post partum uterus. Normal appearance of the LEFT ovary showing low resistance vascular flow. RIGHT ovary not visualized. Electronically Signed   By: Donzetta Kohut M.D.   On: 07/02/2021 10:08    Procedures Procedures    Medications Ordered in ED Medications  sodium chloride 0.9 % bolus 1,000 mL (has no administration in time range)  ondansetron (ZOFRAN) injection 4 mg (has no administration in time range)  fentaNYL (SUBLIMAZE) injection 50 mcg (has no administration in time range)    ED Course/ Medical Decision Making/ A&P Clinical Course as of 07/02/21 1829  Mon Jul 02, 2021  1000 Patient states her pain is better.  Awaiting results of ultrasound. [MB]    Clinical Course User Index [MB] Terrilee Files, MD                           Medical Decision Making Amount and/or Complexity of Data Reviewed Labs: ordered. Radiology: ordered.  Risk Prescription drug management.  This patient complains of left lower  quadrant abdominal pain; this involves an extensive number of treatment Options and is a complaint that carries with it a high risk of complications and morbidity. The differential includes diverticulitis, colitis, UTI, renal colic, uterine infection  I ordered, reviewed and interpreted labs, which included CBC with normal white count, hemoglobin better than priors, chemistries with mildly low bicarb fairly normal LFTs, urinalysis without signs of infection I ordered medication IV pain medication fluids, nausea medication with improvement in her symptoms and reviewed PMP when indicated. I ordered imaging studies which included pelvic ultrasound and CT abdomen and pelvis and I independently    visualized and interpreted imaging which showed no clear findings to explain patient's pain Previous records obtained and reviewed discharge summary from [facility]  Cardiac monitoring reviewed, normal sinus rhythm Social determinants considered, no significant barriers Critical Interventions: None  After the interventions stated above, I reevaluated the patient and found patient symptoms to be improved.  Tolerating p.o. Admission and further testing considered, no indications for admission or further work-up at this time.  Counseled the patient to pump and dump breastmilk.  Follow-up with her OB team.  Will cover with antibiotics for possible endometritis.  Return instructions discussed          Final Clinical Impression(s) / ED Diagnoses Final diagnoses:  LLQ abdominal pain    Rx / DC Orders ED Discharge Orders          Ordered    amoxicillin-clavulanate (AUGMENTIN) 875-125 MG tablet  Every 12 hours        07/02/21 1156              Terrilee Files, MD 07/02/21 1831

## 2022-01-25 ENCOUNTER — Ambulatory Visit
Admission: EM | Admit: 2022-01-25 | Discharge: 2022-01-25 | Disposition: A | Discharge status: Home / Self Care | Payer: BC Managed Care – PPO | Attending: Urgent Care | Admitting: Urgent Care

## 2022-01-25 DIAGNOSIS — Z3202 Encounter for pregnancy test, result negative: Secondary | ICD-10-CM

## 2022-01-25 DIAGNOSIS — K921 Melena: Secondary | ICD-10-CM | POA: Diagnosis not present

## 2022-01-25 DIAGNOSIS — A084 Viral intestinal infection, unspecified: Secondary | ICD-10-CM | POA: Diagnosis not present

## 2022-01-25 HISTORY — DX: Anemia complicating pregnancy, unspecified trimester: O99.019

## 2022-01-25 LAB — POCT URINALYSIS DIP (MANUAL ENTRY)
Bilirubin, UA: NEGATIVE
Glucose, UA: NEGATIVE mg/dL
Ketones, POC UA: NEGATIVE mg/dL
Leukocytes, UA: NEGATIVE
Nitrite, UA: NEGATIVE
Protein Ur, POC: NEGATIVE mg/dL
Spec Grav, UA: >=1.03 — AB (ref 1.010–1.025)
Urobilinogen, UA: 0.2 E.U./dL
pH, UA: 5.5 (ref 5.0–8.0)

## 2022-01-25 LAB — POCT URINE PREGNANCY: Preg Test, Ur: NEGATIVE

## 2022-01-25 MED ORDER — ONDANSETRON 8 MG PO TBDP: 8.0000 mg | ORAL_TABLET | Freq: Three times a day (TID) | ORAL | 0 refills | Status: DC | PRN

## 2022-01-25 MED ORDER — LOPERAMIDE HCL 2 MG PO CAPS: 2.0000 mg | ORAL_CAPSULE | Freq: Two times a day (BID) | ORAL | 0 refills | Status: DC | PRN

## 2022-01-25 NOTE — ED Triage Notes (Signed)
Pt c/o n/v/d, fever x 2 days-last dose tylenol 7am-NAD-steady gait

## 2022-01-25 NOTE — ED Provider Notes (Signed)
[facility]  Note:  This document was prepared using Systems analyst and may include unintentional dictation errors.  MRN: [id number] DOB: 07/11/2000  Subjective:   Regina Zhang is a 22 y.o. female presenting for 2-day history of acute onset persistent fever, nausea, vomiting, diarrhea, abdominal cramps. Has had bloody stools.  She also admits that she has been on her cycle, thinks that the cycle finished about 2 days ago.  Of note, she did eat raw sushi, raw food and the day before her GI symptoms started. No fever, recent antibiotic use, hospitalizations or long distance travel.  Has not drank unfiltered water.  No history of GI disorders including Crohn's, IBS, ulcerative colitis.   No current facility-administered medications for this encounter.  Current Outpatient Medications:    ferrous sulfate 325 (65 FE) MG tablet, Take 325 mg by mouth every other day., Disp: , Rfl:    No Known Allergies  Past Medical History:  Diagnosis Date   Anemia affecting pregnancy    Medical history non-contributory    No known health problems      Past Surgical History:  Procedure Laterality Date   NO PAST SURGERIES      Family History  Family history unknown: Yes    Social History   Tobacco Use   Smoking status: Never   Smokeless tobacco: Never  Vaping Use   Vaping Use: Never used  Substance Use Topics   Alcohol use: Yes    Comment: occ   Drug use: No    ROS   Objective:   Vitals: BP 111/73 (BP Location: Left Arm)   Pulse 72   Temp 98.7 F (37.1 C) (Oral)   Resp 16   LMP 01/16/2022   SpO2 96%   Physical Exam Constitutional:      General: She is not in acute distress.    Appearance: Normal appearance. She is well-developed. She is not ill-appearing, toxic-appearing or diaphoretic.  HENT:     Head: Normocephalic and atraumatic.     Nose: Nose normal.     Mouth/Throat:     Mouth: Mucous membranes are moist.     Pharynx:  No oropharyngeal exudate or posterior oropharyngeal erythema.  Eyes:     General: No scleral icterus.       Right eye: No discharge.        Left eye: No discharge.     Extraocular Movements: Extraocular movements intact.     Conjunctiva/sclera: Conjunctivae normal.  Cardiovascular:     Rate and Rhythm: Normal rate.  Pulmonary:     Effort: Pulmonary effort is normal.  Abdominal:     General: Bowel sounds are increased. There is no distension.     Palpations: Abdomen is soft. There is no mass.     Tenderness: There is generalized abdominal tenderness (throughout). There is no right CVA tenderness, left CVA tenderness, guarding or rebound.  Skin:    General: Skin is warm and dry.  Neurological:     General: No focal deficit present.     Mental Status: She is alert and oriented to person, place, and time.  Psychiatric:        Mood and Affect: Mood normal.        Behavior: Behavior normal.        Thought Content: Thought content normal.        Judgment: Judgment normal.     Results for orders placed or performed during the hospital encounter of  01/25/22 (from the past 24 hour(s))  POCT urine pregnancy     Status: None   Collection Time: 01/25/22  9:46 AM  Result Value Ref Range   Preg Test, Ur Negative Negative  POCT urinalysis dipstick     Status: Abnormal   Collection Time: 01/25/22  9:46 AM  Result Value Ref Range   Color, UA yellow yellow   Clarity, UA clear clear   Glucose, UA negative negative mg/dL   Bilirubin, UA negative negative   Ketones, POC UA negative negative mg/dL   Spec Grav, UA >=1.030 (A) 1.010 - 1.025   Blood, UA moderate (A) negative   pH, UA 5.5 5.0 - 8.0   Protein Ur, POC negative negative mg/dL   Urobilinogen, UA 0.2 0.2 or 1.0 E.U./dL   Nitrite, UA Negative Negative   Leukocytes, UA Negative Negative    Assessment and Plan :   PDMP not reviewed this encounter.  1. Viral gastroenteritis   2. Bloody stools     Will manage for suspected viral  gastroenteritis with supportive care.  Recommended patient hydrate well, eat light meals and maintain electrolytes.  Will use Zofran and Imodium for nausea, vomiting and diarrhea. Counseled patient on potential for adverse effects with medications prescribed/recommended today, ER and return-to-clinic precautions discussed, patient verbalized understanding.    Jaynee Eagles, PA-C 01/25/22 1009

## 2022-01-25 NOTE — ED Notes (Signed)
Pt sent home with stool collection kit/info and advised to return to [facility]

## 2022-01-25 NOTE — Discharge Instructions (Signed)

## 2022-07-08 ENCOUNTER — Other Ambulatory Visit: Payer: Self-pay | Admitting: Nurse Practitioner

## 2022-07-08 ENCOUNTER — Ambulatory Visit: Payer: Self-pay

## 2022-07-08 ENCOUNTER — Encounter: Payer: Self-pay | Admitting: Nurse Practitioner

## 2022-07-08 DIAGNOSIS — M79645 Pain in left finger(s): Secondary | ICD-10-CM

## 2022-08-22 ENCOUNTER — Emergency Department (HOSPITAL_COMMUNITY)
Admission: EM | Admit: 2022-08-22 | Discharge: 2022-08-22 | Disposition: A | Discharge status: Home / Self Care | Payer: BC Managed Care – PPO | Attending: Emergency Medicine | Admitting: Emergency Medicine

## 2022-08-22 ENCOUNTER — Encounter (HOSPITAL_COMMUNITY): Payer: Self-pay | Admitting: Physician Assistant

## 2022-08-22 DIAGNOSIS — R590 Localized enlarged lymph nodes: Secondary | ICD-10-CM | POA: Diagnosis not present

## 2022-08-22 DIAGNOSIS — B349 Viral infection, unspecified: Secondary | ICD-10-CM | POA: Diagnosis not present

## 2022-08-22 DIAGNOSIS — O26891 Other specified pregnancy related conditions, first trimester: Secondary | ICD-10-CM | POA: Insufficient documentation

## 2022-08-22 DIAGNOSIS — Z1152 Encounter for screening for COVID-19: Secondary | ICD-10-CM | POA: Insufficient documentation

## 2022-08-22 DIAGNOSIS — Z3A01 Less than 8 weeks gestation of pregnancy: Secondary | ICD-10-CM | POA: Insufficient documentation

## 2022-08-22 DIAGNOSIS — J069 Acute upper respiratory infection, unspecified: Secondary | ICD-10-CM | POA: Diagnosis not present

## 2022-08-22 LAB — PREGNANCY, URINE: Preg Test, Ur: POSITIVE — AB

## 2022-08-22 LAB — URINALYSIS, ROUTINE W REFLEX MICROSCOPIC
Bilirubin Urine: NEGATIVE
Glucose, UA: NEGATIVE mg/dL
Hgb urine dipstick: NEGATIVE
Ketones, ur: NEGATIVE mg/dL
Leukocytes,Ua: NEGATIVE
Nitrite: NEGATIVE
Protein, ur: NEGATIVE mg/dL
Specific Gravity, Urine: 1.023 (ref 1.005–1.030)
pH: 6 (ref 5.0–8.0)

## 2022-08-22 LAB — SARS CORONAVIRUS 2 BY RT PCR: SARS Coronavirus 2 by RT PCR: NEGATIVE

## 2022-08-22 LAB — GROUP A STREP BY PCR: Group A Strep by PCR: NOT DETECTED

## 2022-08-22 MED ORDER — LIDOCAINE VISCOUS HCL 2 % MT SOLN
15.0000 mL | Freq: Once | OROMUCOSAL | Status: AC
  Administered 2022-08-22: 15 mL via OROMUCOSAL
  Filled 2022-08-22 (×2): qty 15

## 2022-08-22 NOTE — ED Provider Notes (Signed)
[place] EMERGENCY DEPARTMENT AT [facility] Provider Note   CSN: [id number] Arrival date & time: 08/22/22  1045     History  Chief Complaint  Patient presents with   URI    Regina Zhang is a 22 y.o. female.  22 year old female with no PMH presents to the ED with a chief complaint of sore throat, body aches, cough, fever for the last several days.  Patient reports a Tmax of 101 at home, has been taking Tylenol for symptoms without much improvement in symptoms.  Patient is currently employed at [employer], reports she is also had some dry cough.  Her daughter is currently sick with what looks like hand-foot-and-mouth.  She is having worsening pain to her oropharynx especially with swallowing, into the left side.  She also endorses some nausea but has not had any episodes of vomiting, some generalized abdominal pain, she is currently having her menstrual cycle, reports her last menstrual period was last month.  No chest pain, no shortness of breath, no prior history of asthma.  The history is provided by the patient.  URI Presenting symptoms: fever        Home Medications Prior to Admission medications   Medication Sig Start Date End Date Taking? Authorizing Provider  ferrous sulfate 325 (65 FE) MG tablet Take 325 mg by mouth every other day.    [provider]  loperamide (IMODIUM) 2 MG capsule Take 1 capsule (2 mg total) by mouth 2 (two) times daily as needed for diarrhea or loose stools. 01/25/22   Wallis Bamberg, PA-C  ondansetron (ZOFRAN-ODT) 8 MG disintegrating tablet Take 1 tablet (8 mg total) by mouth every 8 (eight) hours as needed for nausea or vomiting. 01/25/22   Wallis Bamberg, PA-C      Allergies    Patient has no known allergies.    Review of Systems   Review of Systems  Constitutional:  Positive for chills and fever.  Respiratory:  Negative for shortness of breath.   Cardiovascular:  Negative for chest pain.  Gastrointestinal:   Positive for abdominal pain and nausea. Negative for vomiting.  Genitourinary:  Negative for difficulty urinating and flank pain.  Musculoskeletal:  Negative for back pain.  All other systems reviewed and are negative.   Physical Exam Updated Vital Signs BP 132/75 (BP Location: Right Arm)   Pulse 74   Temp 98.5 F (36.9 C) (Oral)   Resp 16   Ht 5\' 4"  (1.626 m)   Wt 62.1 kg   LMP 07/04/2022 (Approximate)   SpO2 100%   BMI 23.52 kg/m  Physical Exam Vitals and nursing note reviewed.  Constitutional:      General: She is not in acute distress.    Appearance: She is well-developed.  HENT:     Head: Normocephalic and atraumatic.     Mouth/Throat:     Pharynx: No oropharyngeal exudate.   Eyes:     Pupils: Pupils are equal, round, and reactive to light.  Cardiovascular:     Rate and Rhythm: Regular rhythm.     Heart sounds: Normal heart sounds.  Pulmonary:     Effort: Pulmonary effort is normal. No respiratory distress.     Breath sounds: Normal breath sounds.  Abdominal:     General: Bowel sounds are normal. There is no distension.     Palpations: Abdomen is soft.     Tenderness: There is no abdominal tenderness.  Musculoskeletal:        General:  No tenderness or deformity.     Cervical back: Normal range of motion.     Right lower leg: No edema.     Left lower leg: No edema.  Lymphadenopathy:     Cervical: Cervical adenopathy present.  Skin:    General: Skin is warm and dry.  Neurological:     Mental Status: She is alert and oriented to person, place, and time.     ED Results / Procedures / Treatments   Labs (all labs ordered are listed, but only abnormal results are displayed) Labs Reviewed  PREGNANCY, URINE - Abnormal; Notable for the following components:      Result Value   Preg Test, Ur POSITIVE (*)    All other components within normal limits  SARS CORONAVIRUS 2 BY RT PCR  GROUP A STREP BY PCR  URINALYSIS, ROUTINE W REFLEX MICROSCOPIC     EKG None  Radiology No results found.  Procedures Procedures    Medications Ordered in ED Medications  lidocaine (XYLOCAINE) 2 % viscous mouth solution 15 mL (has no administration in time range)    ED Course/ Medical Decision Making/ A&P Clinical Course as of 08/22/22 1314  Thu Aug 22, 2022  1243 SARS Coronavirus 2 by RT PCR: NEGATIVE [JS]  1247 Preg Test, Ur(!): POSITIVE [JS]    Clinical Course User Index [JS] Claude Manges, PA-C                                 Medical Decision Making Amount and/or Complexity of Data Reviewed Labs: ordered. Decision-making details documented in ED Course.  Risk Prescription drug management.    Patient here with several days of generalized bodyaches, fever with a Tmax of 102, taking Tylenol without any improvement in symptoms.  Also endorsing a cough, sore throat, does have a small child at home who has hand-foot-and-mouth infection at this time.  Vital signs are within normal limits today, did take Tylenol around 10 AM prior to arrival in the emergency department.  Her last menstrual cycle was last month, some concern for pregnancy as her cycle has not come yet.  She does not have any urinary symptoms at this time.  Oropharynx does appear to have a small ulceration to the left tonsil, no tonsillar exudate noted, no PTA noted no voice changes.  Uvula is midline.  Her lungs are clear to auscultation without any wheezing or rales, denies any prior history of asthma.  Abdomen is soft nontender to palpation.  COVID-19 test is negative, strep pharyngitis test is also negative.  Urinalysis without any hemoglobin, no nitrites or leukocytes to account for infection.  We discussed her results at length.  She has a positive pregnancy in the emergency department, reports her last menstrual cycle was approximately last month at the end of the month but it was a short period.  She is not having any vaginal bleeding, lower abdominal cramping, any  gynecological complaint.  She will follow-up with her established gynecologist.  Patient is hemodynamically stable for discharge.    Portions of this note were generated with Scientist, clinical (histocompatibility and immunogenetics). Dictation errors may occur despite best attempts at proofreading.   Final Clinical Impression(s) / ED Diagnoses Final diagnoses:  Viral illness  Less than [redacted] weeks gestation of pregnancy    Rx / DC Orders ED Discharge Orders     None         Claude Manges, [place] 08/22/22 1314  Jacalyn Lefevre, MD 08/22/22 [id number]

## 2022-08-22 NOTE — Discharge Instructions (Addendum)
Your COVID test is negative on today's visit.  Your strep pharyngitis test is also negative.  Your printed test was negative on today's visit, please follow-up with your gynecologist for this.  If you experience any vaginal bleeding, lower abdominal cramping please return to the emergency department.

## 2022-08-22 NOTE — ED Triage Notes (Signed)
Pt c/o cough, sore throat, chills, abd pain, N/V, abd pain, headaches for the last several days.

## 2022-09-17 ENCOUNTER — Emergency Department (HOSPITAL_COMMUNITY)
Admission: EM | Admit: 2022-09-17 | Discharge: 2022-09-17 | Discharge status: Against Medical Advice | Payer: BC Managed Care – PPO | Attending: Emergency Medicine | Admitting: Emergency Medicine

## 2022-09-17 ENCOUNTER — Other Ambulatory Visit: Payer: Self-pay

## 2022-09-17 DIAGNOSIS — R109 Unspecified abdominal pain: Secondary | ICD-10-CM | POA: Diagnosis not present

## 2022-09-17 DIAGNOSIS — Z5321 Procedure and treatment not carried out due to patient leaving prior to being seen by health care provider: Secondary | ICD-10-CM | POA: Insufficient documentation

## 2022-09-17 DIAGNOSIS — Z202 Contact with and (suspected) exposure to infections with a predominantly sexual mode of transmission: Secondary | ICD-10-CM | POA: Insufficient documentation

## 2022-09-17 DIAGNOSIS — O26891 Other specified pregnancy related conditions, first trimester: Secondary | ICD-10-CM | POA: Diagnosis present

## 2022-09-17 DIAGNOSIS — Z3A12 12 weeks gestation of pregnancy: Secondary | ICD-10-CM | POA: Diagnosis not present
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
## 2022-10-29 ENCOUNTER — Encounter: Payer: Self-pay | Admitting: Licensed Practical Nurse

## 2022-10-29 ENCOUNTER — Ambulatory Visit (INDEPENDENT_AMBULATORY_CARE_PROVIDER_SITE_OTHER): Payer: BC Managed Care – PPO | Admitting: Licensed Practical Nurse

## 2022-10-29 ENCOUNTER — Other Ambulatory Visit (HOSPITAL_COMMUNITY)
Admission: RE | Admit: 2022-10-29 | Discharge: 2022-10-29 | Disposition: A | Discharge status: Home / Self Care | Payer: BC Managed Care – PPO | Source: Ambulatory Visit | Attending: Licensed Practical Nurse | Admitting: Licensed Practical Nurse

## 2022-10-29 VITALS — BP 107/75 | HR 76 | Wt 139.3 lb

## 2022-10-29 DIAGNOSIS — Z113 Encounter for screening for infections with a predominantly sexual mode of transmission: Secondary | ICD-10-CM | POA: Insufficient documentation

## 2022-10-29 DIAGNOSIS — Z3A18 18 weeks gestation of pregnancy: Secondary | ICD-10-CM | POA: Insufficient documentation

## 2022-10-29 DIAGNOSIS — Z0283 Encounter for blood-alcohol and blood-drug test: Secondary | ICD-10-CM

## 2022-10-29 DIAGNOSIS — O99013 Anemia complicating pregnancy, third trimester: Secondary | ICD-10-CM | POA: Diagnosis not present

## 2022-10-29 DIAGNOSIS — Z3480 Encounter for supervision of other normal pregnancy, unspecified trimester: Secondary | ICD-10-CM | POA: Insufficient documentation

## 2022-10-29 DIAGNOSIS — Z3A37 37 weeks gestation of pregnancy: Secondary | ICD-10-CM | POA: Diagnosis not present

## 2022-10-29 DIAGNOSIS — Z124 Encounter for screening for malignant neoplasm of cervix: Secondary | ICD-10-CM | POA: Insufficient documentation

## 2022-10-29 DIAGNOSIS — D649 Anemia, unspecified: Secondary | ICD-10-CM | POA: Diagnosis not present

## 2022-10-29 DIAGNOSIS — Z1379 Encounter for other screening for genetic and chromosomal anomalies: Secondary | ICD-10-CM

## 2022-10-29 DIAGNOSIS — Z3482 Encounter for supervision of other normal pregnancy, second trimester: Secondary | ICD-10-CM

## 2022-10-29 DIAGNOSIS — Z3A17 17 weeks gestation of pregnancy: Secondary | ICD-10-CM
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
## 2022-10-31 LAB — HGB FRACTIONATION CASCADE
Hgb A2: 2.8 % (ref 1.8–3.2)
Hgb A: 96.2 % — ABNORMAL LOW (ref 96.4–98.8)
Hgb F: 1 % (ref 0.0–2.0)
Hgb S: 0 %

## 2022-10-31 LAB — RPR+RH+ABO+RUB AB+AB SCR+CB...
Antibody Screen: NEGATIVE
HIV Screen 4th Generation wRfx: NONREACTIVE
Hematocrit: 33 % — ABNORMAL LOW (ref 34.0–46.6)
Hemoglobin: 10.5 g/dL — ABNORMAL LOW (ref 11.1–15.9)
Hepatitis B Surface Ag: NEGATIVE
MCH: 27.1 pg (ref 26.6–33.0)
MCHC: 31.8 g/dL (ref 31.5–35.7)
MCV: 85 fL (ref 79–97)
Platelets: 246 10*3/uL (ref 150–450)
RBC: 3.87 x10E6/uL (ref 3.77–5.28)
RDW: 16.4 % — ABNORMAL HIGH (ref 11.7–15.4)
RPR Ser Ql: NONREACTIVE
Rh Factor: POSITIVE
Rubella Antibodies, IGG: 2.87 {index} (ref >0.99)
Varicella zoster IgG: NONREACTIVE
WBC: 6.1 10*3/uL (ref 3.4–10.8)

## 2022-11-01 ENCOUNTER — Ambulatory Visit (HOSPITAL_COMMUNITY): Payer: BC Managed Care – PPO

## 2022-11-01 ENCOUNTER — Encounter: Payer: Self-pay | Admitting: Licensed Practical Nurse

## 2022-11-01 DIAGNOSIS — Z3480 Encounter for supervision of other normal pregnancy, unspecified trimester: Secondary | ICD-10-CM | POA: Insufficient documentation

## 2022-11-02 LAB — MATERNIT 21 PLUS CORE, BLOOD
Fetal Fraction: 20
Result (T21): NEGATIVE
Trisomy 13 (Patau syndrome): NEGATIVE
Trisomy 18 (Edwards syndrome): NEGATIVE
Trisomy 21 (Down syndrome): NEGATIVE

## 2022-11-03 ENCOUNTER — Inpatient Hospital Stay (HOSPITAL_COMMUNITY)
Admission: AD | Admit: 2022-11-03 | Discharge: 2022-11-03 | Disposition: A | Discharge status: Home / Self Care | Payer: BC Managed Care – PPO | Attending: Obstetrics & Gynecology | Admitting: Obstetrics & Gynecology

## 2022-11-03 ENCOUNTER — Inpatient Hospital Stay (HOSPITAL_BASED_OUTPATIENT_CLINIC_OR_DEPARTMENT_OTHER): Payer: BC Managed Care – PPO

## 2022-11-03 ENCOUNTER — Encounter (HOSPITAL_COMMUNITY): Payer: Self-pay | Admitting: Obstetrics & Gynecology

## 2022-11-03 DIAGNOSIS — Z3A19 19 weeks gestation of pregnancy: Secondary | ICD-10-CM | POA: Insufficient documentation

## 2022-11-03 DIAGNOSIS — B9689 Other specified bacterial agents as the cause of diseases classified elsewhere: Secondary | ICD-10-CM | POA: Insufficient documentation

## 2022-11-03 DIAGNOSIS — N888 Other specified noninflammatory disorders of cervix uteri: Secondary | ICD-10-CM | POA: Diagnosis not present

## 2022-11-03 DIAGNOSIS — O209 Hemorrhage in early pregnancy, unspecified: Secondary | ICD-10-CM | POA: Diagnosis not present

## 2022-11-03 DIAGNOSIS — R42 Dizziness and giddiness: Secondary | ICD-10-CM | POA: Diagnosis not present

## 2022-11-03 DIAGNOSIS — O23592 Infection of other part of genital tract in pregnancy, second trimester: Secondary | ICD-10-CM | POA: Diagnosis not present

## 2022-11-03 DIAGNOSIS — O26892 Other specified pregnancy related conditions, second trimester: Secondary | ICD-10-CM | POA: Diagnosis not present

## 2022-11-03 DIAGNOSIS — O26852 Spotting complicating pregnancy, second trimester: Secondary | ICD-10-CM | POA: Insufficient documentation

## 2022-11-03 LAB — URINALYSIS, ROUTINE W REFLEX MICROSCOPIC
Bilirubin Urine: NEGATIVE
Glucose, UA: NEGATIVE mg/dL
Ketones, ur: 5 mg/dL — AB
Leukocytes,Ua: NEGATIVE
Nitrite: NEGATIVE
Protein, ur: NEGATIVE mg/dL
Specific Gravity, Urine: 1.02 (ref 1.005–1.030)
pH: 7 (ref 5.0–8.0)

## 2022-11-03 LAB — WET PREP, GENITAL
Trich, Wet Prep: NONE SEEN
WBC, Wet Prep HPF POC: <10 (ref <10)
Yeast Wet Prep HPF POC: NONE SEEN

## 2022-11-03 LAB — POCT PREGNANCY, URINE: Preg Test, Ur: POSITIVE — AB

## 2022-11-03 MED ORDER — METRONIDAZOLE 500 MG PO TABS: 500.0000 mg | ORAL_TABLET | Freq: Two times a day (BID) | ORAL | 0 refills | Status: AC

## 2022-11-03 NOTE — MAU Provider Note (Addendum)
History     CSN: [id number]  Arrival date and time: 11/03/22 1123   Event Date/Time   First Provider Initiated Contact with Patient 11/03/22 1240      Chief Complaint  Patient presents with   Vaginal Bleeding   Dizziness   HPI  Ms.Regina Zhang is a 22 y.o. female [phone] @ [redacted]w[redacted]d here with vaginal spotting. This is a new problem. She reports Intercourse 3 days ago, no bleeding post intercourse. Reports red blood noted when she wipes and small clots in the toilet. No pain.   Some dizziness reported today while driving in the car. Does not feel dizziness often or everyday. No chest pain or shortness of breath.   OB History     Gravida  4   Para  3   Term  2   Preterm  1   AB      Living  3      SAB      IAB      Ectopic      Multiple  0   Live Births  3           Past Medical History:  Diagnosis Date   Anemia affecting pregnancy    Medical history non-contributory    No known health problems     Past Surgical History:  Procedure Laterality Date   NO PAST SURGERIES      Family History  Family history unknown: Yes    Social History   Tobacco Use   Smoking status: Never   Smokeless tobacco: Never  Vaping Use   Vaping status: Never Used  Substance Use Topics   Alcohol use: Yes    Comment: occ   Drug use: No    Allergies: No Known Allergies  Medications Prior to Admission  Medication Sig Dispense Refill Last Dose   ferrous sulfate 325 (65 FE) MG tablet Take 325 mg by mouth every other day. (Patient not taking: Reported on 10/29/2022)      loperamide (IMODIUM) 2 MG capsule Take 1 capsule (2 mg total) by mouth 2 (two) times daily as needed for diarrhea or loose stools. (Patient not taking: Reported on 10/29/2022) 14 capsule 0    ondansetron (ZOFRAN-ODT) 8 MG disintegrating tablet Take 1 tablet (8 mg total) by mouth every 8 (eight) hours as needed for nausea or vomiting. (Patient not taking: Reported on 10/29/2022) 20 tablet 0     Results for orders placed or performed during the hospital encounter of 11/03/22 (from the past 48 hour(s))  Urinalysis, Routine w reflex microscopic -Urine, Clean Catch     Status: Abnormal   Collection Time: 11/03/22 11:56 AM  Result Value Ref Range   Color, Urine YELLOW YELLOW   APPearance HAZY (A) CLEAR   Specific Gravity, Urine 1.020 1.005 - 1.030   pH 7.0 5.0 - 8.0   Glucose, UA NEGATIVE NEGATIVE mg/dL   Hgb urine dipstick MODERATE (A) NEGATIVE   Bilirubin Urine NEGATIVE NEGATIVE   Ketones, ur 5 (A) NEGATIVE mg/dL   Protein, ur NEGATIVE NEGATIVE mg/dL   Nitrite NEGATIVE NEGATIVE   Leukocytes,Ua NEGATIVE NEGATIVE   RBC / HPF 0-5 0 - 5 RBC/hpf   WBC, UA 6-10 0 - 5 WBC/hpf   Bacteria, UA RARE (A) NONE SEEN   Squamous Epithelial / HPF 6-10 0 - 5 /HPF   Mucus PRESENT    Hyaline Casts, UA PRESENT     Comment: Performed at [facility], [address]  [place], [place] [address]  Pregnancy, urine POC     Status: Abnormal   Collection Time: 11/03/22 11:59 AM  Result Value Ref Range   Preg Test, Ur POSITIVE (A) NEGATIVE    Comment:        THE SENSITIVITY OF THIS METHODOLOGY IS >24 mIU/mL   Wet prep, genital     Status: Abnormal   Collection Time: 11/03/22  2:27 PM   Specimen: PATH Cytology Cervicovaginal Ancillary Only  Result Value Ref Range   Yeast Wet Prep HPF POC NONE SEEN NONE SEEN   Trich, Wet Prep NONE SEEN NONE SEEN   Clue Cells Wet Prep HPF POC PRESENT (A) NONE SEEN   WBC, Wet Prep HPF POC <10 <10   Sperm PRESENT     Comment: Performed at [facility], [address], [place], [place] [address]     Review of Systems  Constitutional:  Negative for fever.  Gastrointestinal:  Negative for abdominal pain.  Genitourinary:  Positive for vaginal bleeding and vaginal discharge.   Physical Exam   Blood pressure (!) 115/57, pulse 78, temperature 98.3 F (36.8 C), temperature source Oral, resp. rate 18, SpO2 100%, unknown if currently  breastfeeding.  Physical Exam Constitutional:      General: She is not in acute distress.    Appearance: Normal appearance. She is not ill-appearing, toxic-appearing or diaphoretic.  HENT:     Head: Normocephalic.  Abdominal:     Palpations: Abdomen is soft.  Genitourinary:    Comments: Vagina - Small amount of white/mucoid vaginal discharge, no odor  Cervix - scant contact bleeding, no active bleeding, friable cervix.  Bimanual exam: Cervix closed GC/Chlam, wet prep done Chaperone present for exam.   Neurological:     Mental Status: She is alert and oriented to person, place, and time.  Psychiatric:        Behavior: Behavior normal.     MAU Course  Procedures  MDM  O positive blood type + fetal heart tones. US shows posterior placenta and cervical length >3 cm. Urine culture pending  EKG with normal sinus rhythm  Assessment and Plan   A:  1. Bacterial vaginosis   2. Friable cervix   3. [redacted] weeks gestation of pregnancy      P:  Dc home Rx Flagyl Bleeding precautions Pelvic rest Return to MAU if symptoms worse  Taiana Temkin, Victorino Dike I, NP  11/03/2022  3:10 PM

## 2022-11-03 NOTE — MAU Note (Addendum)
...  Regina Zhang is a 22 y.o. at [redacted]w[redacted]d here in MAU reporting: Vaginal bleeding that began last night. She reports it is light red and sees it mostly with wiping. She reports her bleeding has decreased today but reports passing a couple of "pencil eraser" sized blood clots. Last IC three days ago and last pelvic exam last week. Denies vaginal itching and vaginal odors. Denies LOF. Reports flutters.  Reports while driving on the way here she began feeling dizzy for a few minutes and felt as if she was going to fall asleep abruptly but she did not. Reports that has subsided but now has a HA. Does not desire medication for relief.  Onset of complaint: Yesterday Pain score: 8/10 HA Vitals:   11/03/22 1211  BP: (!) 108/58  Pulse: 78  Resp: 18  Temp: 98.3 F (36.8 C)  SpO2: 100%     FHT: 160 doppler Lab orders placed from triage: UA

## 2022-11-04 LAB — GC/CHLAMYDIA PROBE AMP (~~LOC~~) NOT AT ARMC
Chlamydia: NEGATIVE
Comment: NEGATIVE
Comment: NORMAL
Neisseria Gonorrhea: NEGATIVE

## 2022-11-05 ENCOUNTER — Ambulatory Visit
Admission: RE | Admit: 2022-11-05 | Discharge: 2022-11-05 | Disposition: A | Discharge status: Home / Self Care | Payer: BC Managed Care – PPO | Source: Ambulatory Visit | Attending: Licensed Practical Nurse | Admitting: Licensed Practical Nurse

## 2022-11-05 DIAGNOSIS — Z3480 Encounter for supervision of other normal pregnancy, unspecified trimester: Secondary | ICD-10-CM | POA: Diagnosis present

## 2022-11-05 DIAGNOSIS — Z3A17 17 weeks gestation of pregnancy: Secondary | ICD-10-CM | POA: Diagnosis present

## 2022-11-05 DIAGNOSIS — Z3A19 19 weeks gestation of pregnancy: Secondary | ICD-10-CM | POA: Insufficient documentation

## 2022-11-05 DIAGNOSIS — Z3689 Encounter for other specified antenatal screening: Secondary | ICD-10-CM | POA: Insufficient documentation

## 2022-11-05 LAB — CYTOLOGY - PAP
Chlamydia: NEGATIVE
Comment: NEGATIVE
Comment: NEGATIVE
Comment: NORMAL
Diagnosis: NEGATIVE
Neisseria Gonorrhea: NEGATIVE
Trichomonas: NEGATIVE

## 2022-11-05 LAB — CULTURE, OB URINE: Culture: 20000 — AB

## 2022-11-26 ENCOUNTER — Telehealth: Payer: Self-pay | Admitting: Advanced Practice Midwife

## 2022-11-26 ENCOUNTER — Encounter: Payer: BC Managed Care – PPO | Admitting: Advanced Practice Midwife

## 2022-11-26 NOTE — Telephone Encounter (Signed)
Reached out to pt to reschedule ROB appt that was scheduled on 11/26/2022 at 10:15 with JEG.  Was able to reschedule the appt to 11/27/2022 at 2:55 with LMD.

## 2022-11-27 ENCOUNTER — Encounter: Payer: BC Managed Care – PPO | Admitting: Licensed Practical Nurse
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
## 2022-11-28 ENCOUNTER — Encounter: Payer: Self-pay | Admitting: Certified Nurse Midwife

## 2022-11-28 ENCOUNTER — Ambulatory Visit (INDEPENDENT_AMBULATORY_CARE_PROVIDER_SITE_OTHER): Payer: BC Managed Care – PPO | Admitting: Certified Nurse Midwife

## 2022-11-28 VITALS — BP 99/62 | HR 80 | Wt 143.5 lb

## 2022-11-28 DIAGNOSIS — Z0283 Encounter for blood-alcohol and blood-drug test: Secondary | ICD-10-CM

## 2022-11-28 DIAGNOSIS — Z3482 Encounter for supervision of other normal pregnancy, second trimester: Secondary | ICD-10-CM

## 2022-11-28 DIAGNOSIS — Z3A23 23 weeks gestation of pregnancy: Secondary | ICD-10-CM

## 2022-11-28 LAB — POCT URINALYSIS DIPSTICK OB
Bilirubin, UA: NEGATIVE
Blood, UA: NEGATIVE
Glucose, UA: NEGATIVE
Ketones, UA: NEGATIVE
Leukocytes, UA: NEGATIVE
Nitrite, UA: NEGATIVE
POC,PROTEIN,UA: NEGATIVE
Spec Grav, UA: 1.01 (ref 1.010–1.025)
Urobilinogen, UA: 0.2 U/dL
pH, UA: 7 (ref 5.0–8.0)

## 2022-11-28 NOTE — Progress Notes (Signed)
ROB doing well, feeling movement. Denies concerns today. Asks about caffeine . Recommend no more than 1 cup a day. She verbalizes understanding.  Urine drug screen and nicotine collected today. Urine culture collected last visit. Reviewed glucose screen next visit. Follow up 4-5 weeks.   Doreene Burke, CNM

## 2022-11-28 NOTE — Patient Instructions (Signed)
Round Ligament Pain  The round ligaments are a pair of cord-like tissues that help support the uterus. They can become a source of pain during pregnancy as the ligaments soften and stretch as the baby grows. The pain usually begins in the second trimester (13-28 weeks) of pregnancy, and should only last for a few seconds when it occurs. However, the pain can come and go until the baby is delivered. The pain does not cause harm to the baby. Round ligament pain is usually a short, sharp, and pinching pain, but it can also be a dull, lingering, and aching pain. The pain is felt in the lower side of the abdomen or in the groin. It usually starts deep in the groin and moves up to the outside of the hip area. The pain may happen when you: Suddenly change position, such as quickly going from a sitting to standing position. Do physical activity. Cough or sneeze. Follow these instructions at home: Managing pain  When the pain starts, relax. Then, try any of these methods to help with the pain: Sit down. Flex your knees up to your abdomen. Lie on your side with one pillow under your abdomen and another pillow between your legs. Sit in a warm bath for 15-20 minutes or until the pain goes away. General instructions Watch your condition for any changes. Move slowly when you sit down or stand up. Stop or reduce your physical activities if they cause pain. Avoid long walks if they cause pain. Take over-the-counter and prescription medicines only as told by your health care provider. Keep all follow-up visits. This is important. Contact a health care provider if: Your pain does not go away with treatment. You feel pain in your back that you did not have before. Your medicine is not helping. You have a fever or chills. You have nausea or vomiting. You have diarrhea. You have pain when you urinate. Get help right away if: You have pain that is a rhythmic, cramping pain similar to labor pains. Labor  pains are usually 2 minutes apart, last for about 1 minute, and involve a bearing down feeling or pressure in your pelvis. You have vaginal bleeding. These symptoms may represent a serious problem that is an emergency. Do not wait to see if the symptoms will go away. Get medical help right away. Call your local emergency services (911 in the [place] Do not drive yourself to the hospital. Summary Round ligament pain is felt in the lower abdomen or groin. This pain usually begins in the second trimester (13-28 weeks) and should only last for a few seconds when it occurs. You may notice the pain when you suddenly change position, when you cough or sneeze, or during physical activity. Relaxing, flexing your knees to your abdomen, lying on one side, or taking a warm bath may help to get rid of the pain. Contact your health care provider if the pain does not go away. This information is not intended to replace advice given to you by your health care provider. Make sure you discuss any questions you have with your health care provider. Document Revised: 03/08/2020 Document Reviewed: 03/08/2020 Elsevier Patient Education  2024 Elsevier Inc.  

## 2022-11-28 NOTE — Addendum Note (Signed)
Addended by: Fonda Kinder on: 11/28/2022 03:28 PM   Modules accepted: Orders

## 2022-11-30 LAB — MONITOR DRUG PROFILE 14(MW)
Amphetamine Scrn, Ur: NEGATIVE ng/mL
BARBITURATE SCREEN URINE: NEGATIVE ng/mL
BENZODIAZEPINE SCREEN, URINE: NEGATIVE ng/mL
Buprenorphine, Urine: NEGATIVE ng/mL
CANNABINOIDS UR QL SCN: NEGATIVE ng/mL
Cocaine (Metab) Scrn, Ur: NEGATIVE ng/mL
Creatinine(Crt), U: 88 mg/dL (ref 20.0–300.0)
Fentanyl, Urine: NEGATIVE pg/mL
Meperidine Screen, Urine: NEGATIVE ng/mL
Methadone Screen, Urine: NEGATIVE ng/mL
OXYCODONE+OXYMORPHONE UR QL SCN: NEGATIVE ng/mL
Opiate Scrn, Ur: NEGATIVE ng/mL
Ph of Urine: 6.7 (ref 4.5–8.9)
Phencyclidine Qn, Ur: NEGATIVE ng/mL
Propoxyphene Scrn, Ur: NEGATIVE ng/mL
SPECIFIC GRAVITY: 1.015
Tramadol Screen, Urine: NEGATIVE ng/mL

## 2022-11-30 LAB — NICOTINE SCREEN, URINE: Cotinine Ql Scrn, Ur: NEGATIVE ng/mL

## 2022-12-02 ENCOUNTER — Telehealth: Payer: Self-pay

## 2022-12-02 NOTE — Telephone Encounter (Signed)
Patient states employer requesting a note with work restrictions for pregnancy. Letter created and sent to patient via my chart.

## 2022-12-23 ENCOUNTER — Telehealth: Payer: Self-pay

## 2022-12-23 NOTE — Telephone Encounter (Addendum)
Chart reviewed. No record of patient being seen L&D in EPIC or Care Everywhere. Left voicemail for patient to return call regarding after hours call/advice.

## 2022-12-23 NOTE — Telephone Encounter (Signed)
Spoke with Darl Pikes (pt Mother). Requested she have patient return our call.

## 2023-01-01 DIAGNOSIS — O99019 Anemia complicating pregnancy, unspecified trimester: Secondary | ICD-10-CM | POA: Insufficient documentation

## 2023-01-01 DIAGNOSIS — O09899 Supervision of other high risk pregnancies, unspecified trimester: Secondary | ICD-10-CM | POA: Insufficient documentation

## 2023-01-02 ENCOUNTER — Other Ambulatory Visit: Payer: Self-pay

## 2023-01-02 DIAGNOSIS — Z131 Encounter for screening for diabetes mellitus: Secondary | ICD-10-CM

## 2023-01-02 DIAGNOSIS — Z113 Encounter for screening for infections with a predominantly sexual mode of transmission: Secondary | ICD-10-CM

## 2023-01-02 DIAGNOSIS — D649 Anemia, unspecified: Secondary | ICD-10-CM

## 2023-01-03 ENCOUNTER — Ambulatory Visit (INDEPENDENT_AMBULATORY_CARE_PROVIDER_SITE_OTHER): Payer: BC Managed Care – PPO

## 2023-01-03 ENCOUNTER — Other Ambulatory Visit: Payer: BC Managed Care – PPO

## 2023-01-03 VITALS — BP 108/55 | HR 81 | Wt 154.0 lb

## 2023-01-03 DIAGNOSIS — Z131 Encounter for screening for diabetes mellitus: Secondary | ICD-10-CM

## 2023-01-03 DIAGNOSIS — D649 Anemia, unspecified: Secondary | ICD-10-CM

## 2023-01-03 DIAGNOSIS — Z3A28 28 weeks gestation of pregnancy: Secondary | ICD-10-CM

## 2023-01-03 DIAGNOSIS — O99012 Anemia complicating pregnancy, second trimester: Secondary | ICD-10-CM

## 2023-01-03 DIAGNOSIS — O09899 Supervision of other high risk pregnancies, unspecified trimester: Secondary | ICD-10-CM

## 2023-01-03 DIAGNOSIS — Z23 Encounter for immunization: Secondary | ICD-10-CM | POA: Diagnosis not present

## 2023-01-03 DIAGNOSIS — Z113 Encounter for screening for infections with a predominantly sexual mode of transmission: Secondary | ICD-10-CM

## 2023-01-03 DIAGNOSIS — Z2839 Other underimmunization status: Secondary | ICD-10-CM

## 2023-01-03 DIAGNOSIS — O99013 Anemia complicating pregnancy, third trimester: Secondary | ICD-10-CM

## 2023-01-03 DIAGNOSIS — Z3480 Encounter for supervision of other normal pregnancy, unspecified trimester: Secondary | ICD-10-CM

## 2023-01-03 NOTE — Assessment & Plan Note (Addendum)
-   Gestational diabetes screening, third trimester labs, Tdap, and BTFC completed today. - Reviewed dating of pregnancy. Currently dated by [redacted]w[redacted]d [place], however, this [place] is not in our records. Patient states that she had an ultrasound at [facility] in [place] around this time. Records requested.  - Reviewed kick counts and preterm labor warning signs. Instructed to call office or come to hospital with persistent headache, vision changes, regular contractions, leaking of fluid, decreased fetal movement or vaginal bleeding.

## 2023-01-03 NOTE — Progress Notes (Signed)
    Return Prenatal Note   Assessment/Plan   Plan  22 y.o. [phone] at [redacted]w[redacted]d presents for follow-up OB visit. Reviewed prenatal record including previous visit note.  Supervision of other normal pregnancy, antepartum - Gestational diabetes screening, third trimester labs, Tdap, and BTFC completed today. - Reviewed dating of pregnancy. Currently dated by [redacted]w[redacted]d [place], however, this [place] is not in our records. Patient states that she had an ultrasound at [facility] in [place] around this time. Records requested.  - Reviewed kick counts and preterm labor warning signs. Instructed to call office or come to hospital with persistent headache, vision changes, regular contractions, leaking of fluid, decreased fetal movement or vaginal bleeding.   Orders Placed This Encounter  Procedures   Tdap vaccine greater than or equal to 7yo IM   Return in about 2 weeks (around 01/17/2023) for ROB.   Future Appointments  Date Time Provider Department Center  01/17/2023  3:35 PM Dominica Severin, CNM [facility] None    For next visit:  continue with routine prenatal care     Subjective   22 y.o. [phone] at [redacted]w[redacted]d presents for this follow-up prenatal visit.  Patient has no concerns today. Patient reports: Movement: Present Contractions: Irritability  Objective   Flow sheet Vitals: Pulse Rate: 81 BP: (!) 108/55 Fundal Height: 28 cm Fetal Heart Rate (bpm): 143 Total weight gain: Not found.  General Appearance  No acute distress, well appearing, and well nourished Pulmonary   Normal work of breathing Neurologic   Alert and oriented to person, place, and time Psychiatric   Mood and affect within normal limits  Lindalou Hose Merritt Kibby, CNM  01/02/2410:10 AM

## 2023-01-04 LAB — 28 WEEK RH+PANEL
Basophils Absolute: 0 10*3/uL (ref 0.0–0.2)
Basos: 0 %
EOS (ABSOLUTE): 0.1 10*3/uL (ref 0.0–0.4)
Eos: 1 %
Gestational Diabetes Screen: 96 mg/dL (ref 70–139)
HIV Screen 4th Generation wRfx: NONREACTIVE
Hematocrit: 30.8 % — ABNORMAL LOW (ref 34.0–46.6)
Hemoglobin: 9.7 g/dL — ABNORMAL LOW (ref 11.1–15.9)
Immature Grans (Abs): 0 10*3/uL (ref 0.0–0.1)
Immature Granulocytes: 0 %
Lymphocytes Absolute: 1.9 10*3/uL (ref 0.7–3.1)
Lymphs: 30 %
MCH: 26.4 pg — ABNORMAL LOW (ref 26.6–33.0)
MCHC: 31.5 g/dL (ref 31.5–35.7)
MCV: 84 fL (ref 79–97)
Monocytes Absolute: 0.5 10*3/uL (ref 0.1–0.9)
Monocytes: 8 %
Neutrophils Absolute: 3.9 10*3/uL (ref 1.4–7.0)
Neutrophils: 61 %
Platelets: 215 10*3/uL (ref 150–450)
RBC: 3.67 x10E6/uL — ABNORMAL LOW (ref 3.77–5.28)
RDW: 14.5 % (ref 11.7–15.4)
RPR Ser Ql: NONREACTIVE
WBC: 6.5 10*3/uL (ref 3.4–10.8)

## 2023-01-06 ENCOUNTER — Other Ambulatory Visit: Payer: Self-pay | Admitting: Certified Nurse Midwife

## 2023-01-06 ENCOUNTER — Encounter: Payer: Self-pay | Admitting: Certified Nurse Midwife

## 2023-01-17 ENCOUNTER — Encounter: Payer: BC Managed Care – PPO | Admitting: Certified Nurse Midwife

## 2023-01-17 DIAGNOSIS — Z3A30 30 weeks gestation of pregnancy: Secondary | ICD-10-CM

## 2023-01-17 DIAGNOSIS — Z3480 Encounter for supervision of other normal pregnancy, unspecified trimester: Secondary | ICD-10-CM

## 2023-01-17 DIAGNOSIS — O09893 Supervision of other high risk pregnancies, third trimester: Secondary | ICD-10-CM

## 2023-01-24 ENCOUNTER — Telehealth: Payer: Self-pay

## 2023-01-24 DIAGNOSIS — Z3A31 31 weeks gestation of pregnancy: Secondary | ICD-10-CM

## 2023-01-24 DIAGNOSIS — Z3480 Encounter for supervision of other normal pregnancy, unspecified trimester: Secondary | ICD-10-CM

## 2023-01-24 NOTE — Telephone Encounter (Signed)
Reached out to pt to reschedule ROB appt that was scheduled with S. Free on 1/17/205 at 10:55.  Left message for pt to call back.

## 2023-01-27 NOTE — Telephone Encounter (Signed)
Reached out to pt (2x) to reschedule ROB appt that was scheduled with S. Free on 01/24/2023 at 10:55.  Was able to reschedule the appt to 1/22 at 1:55 with A. Janee Morn.

## 2023-01-29 ENCOUNTER — Encounter: Payer: BC Managed Care – PPO | Admitting: Certified Nurse Midwife

## 2023-01-29 ENCOUNTER — Telehealth: Payer: Self-pay | Admitting: Certified Nurse Midwife

## 2023-01-29 NOTE — Telephone Encounter (Signed)
Reached out to pt to reschedule ROB appt that was scheduled on 01/29/2023 at 1:55 with A. Janee Morn.  Could not leave a message bc mailbox was full.

## 2023-01-30 NOTE — Telephone Encounter (Signed)
Reached out to pt (2x) to reschedule ROB appt that was scheduled on 01/29/2023 at 1:55 with A. Janee Morn.  Pt answered, but wanted to call back to reschedule the appt.

## 2023-02-11 ENCOUNTER — Telehealth: Payer: Self-pay | Admitting: Obstetrics

## 2023-02-11 ENCOUNTER — Encounter: Payer: BC Managed Care – PPO | Admitting: Obstetrics

## 2023-02-11 NOTE — Telephone Encounter (Signed)
Reached out to pt to reschedule ROB appt that was scheduled on 02/11/2023 at 3L35 with Quitman Livings.  Was able to reschedule the appt for 02/14/2023 at 1:15 with Quitman Livings.

## 2023-02-14 ENCOUNTER — Encounter: Payer: BC Managed Care – PPO | Admitting: Obstetrics

## 2023-02-14 ENCOUNTER — Telehealth: Payer: Self-pay | Admitting: Obstetrics

## 2023-02-14 DIAGNOSIS — Z3480 Encounter for supervision of other normal pregnancy, unspecified trimester: Secondary | ICD-10-CM

## 2023-02-14 DIAGNOSIS — O99019 Anemia complicating pregnancy, unspecified trimester: Secondary | ICD-10-CM

## 2023-02-14 DIAGNOSIS — O9A219 Injury, poisoning and certain other consequences of external causes complicating pregnancy, unspecified trimester: Secondary | ICD-10-CM

## 2023-02-14 NOTE — Telephone Encounter (Signed)
 Reached out to pt to reschedule ROB appt that was scheduled on 02/14/2023 at 1:15 with Cinda Craze.  Was able to reschedule appt for 02/17/2023 at 3:15 with Cinda Craze.

## 2023-02-17 ENCOUNTER — Telehealth: Payer: Self-pay | Admitting: Obstetrics

## 2023-02-17 ENCOUNTER — Encounter: Payer: BC Managed Care – PPO | Admitting: Obstetrics

## 2023-02-17 NOTE — Telephone Encounter (Signed)
 Reached out to pt to reschedule ROB appt that was scheduled on 02/17/2023 at 3:15 with Cinda Craze.  Was able to reschedule the appt to 02/18/2023 at 2:55 with LMD.

## 2023-02-18 ENCOUNTER — Ambulatory Visit (INDEPENDENT_AMBULATORY_CARE_PROVIDER_SITE_OTHER): Payer: BC Managed Care – PPO | Admitting: Licensed Practical Nurse

## 2023-02-18 VITALS — BP 113/47 | HR 88 | Wt 162.9 lb

## 2023-02-18 DIAGNOSIS — M5431 Sciatica, right side: Secondary | ICD-10-CM | POA: Insufficient documentation

## 2023-02-18 DIAGNOSIS — Z3480 Encounter for supervision of other normal pregnancy, unspecified trimester: Secondary | ICD-10-CM

## 2023-02-18 DIAGNOSIS — Z3A34 34 weeks gestation of pregnancy: Secondary | ICD-10-CM

## 2023-02-18 DIAGNOSIS — O99891 Other specified diseases and conditions complicating pregnancy: Secondary | ICD-10-CM | POA: Diagnosis not present

## 2023-02-18 DIAGNOSIS — Z3403 Encounter for supervision of normal first pregnancy, third trimester: Secondary | ICD-10-CM

## 2023-02-18 DIAGNOSIS — O99013 Anemia complicating pregnancy, third trimester: Secondary | ICD-10-CM

## 2023-02-18 LAB — POCT URINALYSIS DIPSTICK
Bilirubin, UA: NEGATIVE
Blood, UA: NEGATIVE
Glucose, UA: NEGATIVE
Ketones, UA: NEGATIVE
Leukocytes, UA: NEGATIVE
Nitrite, UA: NEGATIVE
Protein, UA: NEGATIVE
Spec Grav, UA: 1.02 (ref 1.010–1.025)
Urobilinogen, UA: 0.2 U/dL
pH, UA: 7.5 (ref 5.0–8.0)

## 2023-02-18 NOTE — Progress Notes (Unsigned)
    Return Prenatal Note   Subjective   23 y.o. [id number] at [redacted]w[redacted]d presents for this follow-up prenatal visit.  Regina Zhang is doing well overall but has been experiencing some right sided pain that begins on the right side of her back/hip and shoots down her leg. It intensifies with standing. She denies numbness and tingling in right lower extremity.  She reports she has been out of work for 2 weeks because of this pain. She denies urinary symptoms of urgency and burning. She denies fevers. Active baby! She has her daughters with her this visit.  Movement: Present Contractions: Irritability  Objective   Flow sheet Vitals: Pulse Rate: 88 BP: (!) 113/47 Fundal Height: 35 cm Fetal Heart Rate (bpm): 145 Total weight gain: Not found.  General Appearance  No acute distress, well appearing, and well nourished Pulmonary   Normal work of breathing Neurologic   Alert and oriented to person, place, and time Psychiatric   Mood and affect within normal limits  GI/GU: Mild CVA tenderness on R side     Assessment/Plan   Plan  23 y.o. [id number] at [redacted]w[redacted]d presents for follow-up OB visit. Reviewed prenatal record including previous visit note.  Supervision of other normal pregnancy, antepartum -RTC in 2 weeks.  -GBS next visit.  -Third trimester precautions discussed.   Sciatic pain, right -Symptoms consistent with sciatic pain.  -PT referral placed -We discussed some stretches that may help with pain. Reassurance provided that this usually resolves after birth.  -Urine dip negative       Orders Placed This Encounter  Procedures   CBC   Ambulatory referral to Physical Therapy    Referral Priority:   Routine    Referral Type:   Physical Medicine    Referral Reason:   Specialty Services Required    Requested Specialty:   Physical Therapy    Number of Visits Requested:   1   POCT Urinalysis Dipstick   No follow-ups on file.   Future Appointments  Date Time Provider Department Center   03/04/2023 10:55 AM Slaughterbeck, Cerutti Burton, CNM [facility] None    For next visit:  continue with routine prenatal care     Eloise Levels, Student-MidWife  02/11/255:07 PM   Carie Caddy, CNM present for all portions of care.

## 2023-02-18 NOTE — Assessment & Plan Note (Addendum)
-  Symptoms consistent with sciatic pain.  -PT referral placed -We discussed some stretches that may help with pain. Reassurance provided that this usually resolves after birth.  -Urine dip negative

## 2023-02-18 NOTE — Assessment & Plan Note (Signed)
-  RTC in 2 weeks.  -GBS next visit.  -Third trimester precautions discussed.

## 2023-02-19 ENCOUNTER — Encounter: Payer: Self-pay | Admitting: Licensed Practical Nurse

## 2023-02-19 ENCOUNTER — Other Ambulatory Visit: Payer: Self-pay | Admitting: Licensed Practical Nurse

## 2023-02-19 LAB — CBC
Hematocrit: 28.4 % — ABNORMAL LOW (ref 34.0–46.6)
Hemoglobin: 9 g/dL — ABNORMAL LOW (ref 11.1–15.9)
MCH: 24.8 pg — ABNORMAL LOW (ref 26.6–33.0)
MCHC: 31.7 g/dL (ref 31.5–35.7)
MCV: 78 fL — ABNORMAL LOW (ref 79–97)
Platelets: 287 10*3/uL (ref 150–450)
RBC: 3.63 x10E6/uL — ABNORMAL LOW (ref 3.77–5.28)
RDW: 15.1 % (ref 11.7–15.4)
WBC: 7 10*3/uL (ref 3.4–10.8)

## 2023-02-19 NOTE — Progress Notes (Signed)
Pt with anemia in pregnancy, HBG 9.0 after supplementation. IV iron ordered. Pt called, reviewed results and plans. Number for same day surgery sent through mychart. Orders placed.  Carie Caddy, CNM  [facility]  02/19/23  11:35 AM

## 2023-02-28 ENCOUNTER — Encounter: Payer: Self-pay | Admitting: Rehabilitative and Restorative Service Providers"

## 2023-02-28 ENCOUNTER — Inpatient Hospital Stay
Admission: RE | Admit: 2023-02-28 | Discharge: 2023-02-28 | Disposition: A | Discharge status: Home / Self Care | Payer: BC Managed Care – PPO | Source: Ambulatory Visit

## 2023-02-28 ENCOUNTER — Other Ambulatory Visit: Payer: Self-pay

## 2023-02-28 ENCOUNTER — Ambulatory Visit
Payer: BC Managed Care – PPO | Attending: Licensed Practical Nurse | Admitting: Rehabilitative and Restorative Service Providers"

## 2023-02-28 DIAGNOSIS — M545 Low back pain, unspecified: Secondary | ICD-10-CM | POA: Insufficient documentation

## 2023-02-28 DIAGNOSIS — Z3A36 36 weeks gestation of pregnancy: Secondary | ICD-10-CM | POA: Insufficient documentation

## 2023-02-28 DIAGNOSIS — M549 Dorsalgia, unspecified: Secondary | ICD-10-CM | POA: Insufficient documentation

## 2023-02-28 DIAGNOSIS — M5431 Sciatica, right side: Secondary | ICD-10-CM | POA: Insufficient documentation

## 2023-02-28 DIAGNOSIS — R262 Difficulty in walking, not elsewhere classified: Secondary | ICD-10-CM | POA: Insufficient documentation

## 2023-02-28 DIAGNOSIS — R252 Cramp and spasm: Secondary | ICD-10-CM | POA: Insufficient documentation

## 2023-02-28 DIAGNOSIS — O99891 Other specified diseases and conditions complicating pregnancy: Secondary | ICD-10-CM | POA: Diagnosis present

## 2023-02-28 DIAGNOSIS — Z3403 Encounter for supervision of normal first pregnancy, third trimester: Secondary | ICD-10-CM | POA: Insufficient documentation

## 2023-02-28 DIAGNOSIS — M6281 Muscle weakness (generalized): Secondary | ICD-10-CM | POA: Insufficient documentation

## 2023-02-28 NOTE — Patient Instructions (Signed)
   [facility] Welcome to [facility]! Here you will find all the information you will need regarding your pool therapy. If you have further questions at any time, please call our office at [phone]. After completing your initial evaluation in the [facility], you may be eligible to complete a portion of your therapy in the pool. A typical week of therapy will consist of 1-2 typical physical therapy visits at our [facility] location and an additional session of therapy in the pool located at the [facility] at [facility]. [address] [place] [address]. The phone number at the pool site is [phone]. Please call this number if you are running late or need to cancel your appointment.  Aquatic therapy will be offered on Wednesday mornings and Friday afternoons. Each session will last approximately 45 minutes. All scheduling and payments for aquatic therapy sessions, including cancelations, will be done through our [facility] location.  To be eligible for aquatic therapy, these criteria must be met: You must be able to independently change in the locker room and get to the pool deck. A caregiver can come with you to help if needed. There are benches for a caregiver to sit on next to the pool. No one with an open wound is permitted in the pool.  Handicap parking is available in the front and there is a drop off option for even closer accessibility. Please arrive 15 minutes prior to your appointment to prepare for your pool session. You must sign in at the front desk upon your arrival. Please be sure to attend to any toileting needs prior to entering the pool. Locker rooms for changing are available.  There is direct access to the pool deck from the locker room. You can lock your belongings in a locker or bring them with you poolside. Your therapist will greet you on the pool deck. There may be other swimmers in the pool at the  same time but your session is one-on-one with the therapist.

## 2023-02-28 NOTE — Progress Notes (Signed)
 Patient has no call no show for her Iron appointment.

## 2023-02-28 NOTE — Therapy (Addendum)
 OUTPATIENT PHYSICAL THERAPY THORACOLUMBAR EVALUATION AND LATE ENTRY DISCHARGE SUMMARY   Patient Name: Regina Zhang MRN: [id number] DOB:[birth year], 23 y.o., female Today's Date: 02/28/2023  END OF SESSION:  PT End of Session - 02/28/23 0935     Visit Number 1    Date for PT Re-Evaluation 03/28/23    Authorization Type BC/BS    PT Start Time 0930    PT Stop Time 1010    PT Time Calculation (min) 40 min    Activity Tolerance Patient tolerated treatment well    Behavior During Therapy [facility] for tasks assessed/performed             Past Medical History:  Diagnosis Date   Anemia affecting pregnancy    Medical history non-contributory    No known health problems    Past Surgical History:  Procedure Laterality Date   NO PAST SURGERIES     Patient Active Problem List   Diagnosis Date Noted   Sciatic pain, right 02/18/2023   Maternal varicella, non-immune 01/01/2023   Anemia affecting pregnancy 01/01/2023   Supervision of other normal pregnancy, antepartum 11/01/2022   Traumatic injury during pregnancy 06/07/2021   Abdominal pain during pregnancy in third trimester 03/09/2020   Back pain affecting pregnancy 01/08/2020    PCP: [place] [facility]  REFERRING PROVIDER: Ellwood Sayers, CNM  REFERRING DIAG: [phone] (ICD-10-CM) - Encounter for supervision of normal first pregnancy in third trimester M54.31 (ICD-10-CM) - Sciatic pain, right  Rationale for Evaluation and Treatment: Rehabilitation  THERAPY DIAG:  Back pain during pregnancy - Plan: PT plan of care cert/re-cert  Muscle weakness (generalized) - Plan: PT plan of care cert/re-cert  Cramp and spasm - Plan: PT plan of care cert/re-cert  Difficulty in walking, not elsewhere classified - Plan: PT plan of care cert/re-cert  ONSET DATE: One Month ago  SUBJECTIVE:                                                                                                                                                                                            SUBJECTIVE STATEMENT: Patient is [redacted] weeks pregnant with an estimated due date of 03/27/2023.  Patient has 3 children ranging from 1-4 years and is pregnant with her 4th.  PERTINENT HISTORY:  X3 uncomplicated vaginal deliveries (3 girls), Pregnant with 4th child with estimated due date of 03/27/2023 (a boy)  PAIN:  Are you having pain? Yes: NPRS scale: 4 Pain location: low back pain radiating down right leg Pain description: aching to "super sharp" Aggravating factors: Getting up out of bed and out of chairs Relieving factors: unknown  PRECAUTIONS:  Other: Pregnancy  RED FLAGS: None   WEIGHT BEARING RESTRICTIONS: No  FALLS:  Has patient fallen in last 6 months? 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  NEXT MD VISIT: Routine OB appointments  OBJECTIVE:  Note: Objective measures were completed at Evaluation unless otherwise noted.  DIAGNOSTIC FINDINGS:  Routine ultrasounds  PATIENT SURVEYS:  Modified Oswestry 21 / 50 = 42.0 %   COGNITION: Overall cognitive status: Within functional limits for tasks assessed     SENSATION: Patient denies.  Patient does report pain with sexual intercourse.  MUSCLE LENGTH: Hamstrings: tightness bilaterally  POSTURE: rounded shoulders and anterior pelvic tilt  PALPATION: Tender to palpation along lumbar paraspinals  LUMBAR ROM:   Painful with right rotation  LOWER EXTREMITY ROM:     WFL  LOWER EXTREMITY MMT:    Eval: Right LE strength grossly 4 to 4+/5 throughout Left LE strength is  WFL  LUMBAR SPECIAL TESTS:  Slump test: positive on the right  FUNCTIONAL TESTS:  Eval: 5 times sit to stand: 15.82 sec without increased pain reported  GAIT: Distance walked: >500 ft Assistive device utilized: None Level of assistance: Complete Independence Comments: Patient reports that she starts having pain after 5-10 min  TODAY'S TREATMENT:  DATE: 02/28/2023       Issued HEP and discussed belly band (with patient able to trial in clinic) Child's pose x20 sec Quadruped slow rocking x20 Forward lean onto physioball on table with lateral sway x1 min   PATIENT EDUCATION:  Education details: Issued HEP, provided handout on belly band, and provided handout on aquatic PT Person educated: Patient Education method: Explanation, Demonstration, and Handouts Education comprehension: verbalized understanding and returned demonstration  HOME EXERCISE PROGRAM: Access Code: [email or web address] URL: [email or web address] Date: 02/28/2023 Prepared by: Clydie Braun Olaoluwa Grieder  Exercises - Child's Pose Stretch  - 1 x daily - 7 x weekly - 2 sets - 3 reps - 45s hold - Quadruped Rocking Slow  - 1 x daily - 7 x weekly - 2 sets - 3 reps - 45s hold - Pigeon Pose  - 1 x daily - 7 x weekly - 2 sets - 3 reps - 45s hold - Sidelying Open Book Thoracic Lumbar Rotation and Extension  - 1 x daily - 7 x weekly - 2 sets - 10 reps - Sidelying Reverse Clamshell  - 1 x daily - 7 x weekly - 2 sets - 10 reps - Supine Bilateral Hip Internal Rotation Stretch  - 1 x daily - 7 x weekly - 2 sets - 10 reps - Deep Squat with Pelvic Floor Relaxation  - 1 x daily - 7 x weekly - 2 sets - 10 reps - Seated Hamstring Stretch  - 1 x daily - 7 x weekly - 2 reps - 20 sec hold  ASSESSMENT:  CLINICAL IMPRESSION: Patient is a 23 y.o. female who was seen today for physical therapy evaluation and treatment for back pain and right sided sciatica during pregnancy.  Patient has 3 daughters with uncomplicated pregnancies and vaginal  deliveries (ages ranging up to 22 years old).  She started having back pain approximately a month ago  and has led her to having increased pain working at [employer].  She returns to work tomorrow with a 20# lifting restriction.  Patient provided with sizing of maternity band and she reported decreased pain with use.  Educated pain on places to purchase and she states that she will likely go try to purchase one at [employer] after leaving PT appointment.  Patient provided with handout of exercises to prepare her for birth and decrease her pain and recommended aquatic PT to assist with decreased pain during exercises.  Patient would benefit from skilled PT to address her functional impairments to allow her to have decreased pain prior to delivery of her son.  OBJECTIVE IMPAIRMENTS: decreased balance, decreased coordination, difficulty walking, decreased strength, increased muscle spasms, impaired flexibility, postural dysfunction, and pain.   ACTIVITY LIMITATIONS: carrying, lifting, bending, standing, squatting, and bed mobility  PARTICIPATION LIMITATIONS: cleaning, laundry, community activity, and occupation  PERSONAL FACTORS: 1 comorbidity: Pregnancy with estimated due date of 03/27/23  are also affecting patient's functional outcome.   REHAB POTENTIAL: Good  CLINICAL DECISION MAKING: Stable/uncomplicated  EVALUATION COMPLEXITY: Low   GOALS: Goals reviewed with patient? Yes  SHORT TERM GOALS: Target date: 03/14/2023  Patient will be independent with initial HEP. Baseline: Goal status: INITIAL  2.  Patient will report a 25% improvement in symptoms. Baseline:  Goal status: INITIAL   LONG TERM GOALS: Target date: 03/28/2023  Patient will be independent with advanced HEP to allow for self progression after discharge. Baseline:  Goal status: INITIAL  2.  Patient will improve right hip/knee and trunk strength to [facility] to allow her to safely squat and lift her  daughters. Baseline:  Goal status: INITIAL  3.  Patient will be able to demonstrate lifting 20# from floor without pain to allow her to lift the car seat and pick up her children. Baseline:  Goal status: INITIAL  4.  Patient will report ability to return to walking at the park or neighborhood with her children without increased pain. Baseline:  Goal status: INITIAL   PLAN:  PT FREQUENCY: 2x/week  PT DURATION: 4 weeks  PLANNED INTERVENTIONS: 97164- PT Re-evaluation, 97110-Therapeutic exercises, 97530- Therapeutic activity, 97112- Neuromuscular re-education, 97535- Self Care, 82956- Manual therapy, [phone]- Gait training, [phone]- Orthotic Fit/training, [phone]- Aquatic Therapy, Patient/Family education, Balance training, Stair training, Taping, Dry Needling, Joint mobilization, Joint manipulation, Spinal manipulation, Spinal mobilization, Cryotherapy, and Moist heat.  PLAN FOR NEXT SESSION: Assess and progress HEP as indicated, strengthening, flexibility, manual/dry needling as indicated, Aquatic PT   Reather Laurence, PT, DPT 02/28/23, 10:38 AM  [facility] [address] [place], [place] [address] Phone # [phone] Fax [phone]   PHYSICAL THERAPY DISCHARGE SUMMARY  As of 03/14/2023, patient has not returned for any visits since initial evaluation. Per documentation from OB office in the chart, patient started having contractions last night (at [redacted] weeks pregnant) and was advised to go to the ED.  No further scheduled PT visits in chart.  Patient discharged at this time to follow orders per [facility] and hospital.  Patient agrees to discharge. Patient goals were  not met, Eval Only . Patient is being discharged due to not returning since the last visit.  Clydie Braun Laila Myhre, PT, DPT 03/14/23, 11:27 AM

## 2023-03-04 ENCOUNTER — Telehealth: Payer: Self-pay | Admitting: Certified Nurse Midwife

## 2023-03-04 ENCOUNTER — Encounter: Payer: BC Managed Care – PPO | Admitting: Certified Nurse Midwife

## 2023-03-04 NOTE — Telephone Encounter (Signed)
 Pt was scheduled on 03/04/23 at 10:55 with E. Slaughterbeck.  Pt came to the appt at 11:16.  Pt had to be rescheduled.  She is scheduled on 03/06/2023 at 1:55  with Quitman Livings.

## 2023-03-04 NOTE — Therapy (Deleted)
 OUTPATIENT PHYSICAL THERAPY THORACOLUMBAR PROGRESS NOTE   Patient Name: Regina Zhang MRN: [id number] DOB:05-16-2000, 23 y.o., female Today's Date: 03/04/2023  END OF SESSION:    Past Medical History:  Diagnosis Date   Anemia affecting pregnancy    Medical history non-contributory    No known health problems    Past Surgical History:  Procedure Laterality Date   NO PAST SURGERIES     Patient Active Problem List   Diagnosis Date Noted   Sciatic pain, right 02/18/2023   Maternal varicella, non-immune 01/01/2023   Anemia affecting pregnancy 01/01/2023   Supervision of other normal pregnancy, antepartum 11/01/2022   Traumatic injury during pregnancy 06/07/2021   Abdominal pain during pregnancy in third trimester 03/09/2020   Back pain affecting pregnancy 01/08/2020    [facility]  REFERRING PROVIDER: Ellwood Sayers, CNM  REFERRING DIAG: [phone] (ICD-10-CM) - Encounter for supervision of normal first pregnancy in third trimester M54.31 (ICD-10-CM) - Sciatic pain, right  Rationale for Evaluation and Treatment: Rehabilitation  THERAPY DIAG:  Back pain during pregnancy  Muscle weakness (generalized)  Cramp and spasm  Difficulty in walking, not elsewhere classified  ONSET DATE: One Month ago  SUBJECTIVE:                                                                                                                                                                                           SUBJECTIVE STATEMENT:  PERTINENT HISTORY:  X3 uncomplicated vaginal deliveries (3 girls), Pregnant with 4th child with estimated due date of 03/27/2023 (a boy)  PAIN:  Are you having pain? Yes: NPRS scale: 4 Pain location: low back pain radiating down right leg Pain description: aching to "super sharp" Aggravating factors: Getting up out of bed and out of chairs Relieving factors: unknown  PRECAUTIONS: Other: Pregnancy  RED FLAGS: None   WEIGHT  BEARING RESTRICTIONS: No  FALLS:  Has patient fallen in last 6 months? 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  NEXT MD VISIT: Routine OB appointments  OBJECTIVE:  Note: Objective measures were completed at Evaluation unless otherwise noted.  DIAGNOSTIC FINDINGS:  Routine ultrasounds  PATIENT SURVEYS:  Modified Oswestry 21 / 50 = 42.0 %   COGNITION: Overall cognitive status: Within functional limits  for tasks assessed     SENSATION: Patient denies.  Patient does report pain with sexual intercourse.  MUSCLE LENGTH: Hamstrings: tightness bilaterally  POSTURE: rounded shoulders and anterior pelvic tilt  PALPATION: Tender to palpation along lumbar paraspinals  LUMBAR ROM:   Painful with right rotation  LOWER EXTREMITY ROM:     WFL  LOWER EXTREMITY MMT:    Eval: Right LE strength grossly 4 to 4+/5 throughout Left LE strength is WFL  LUMBAR SPECIAL TESTS:  Slump test: positive on the right  FUNCTIONAL TESTS:  Eval: 5 times sit to stand: 15.82 sec without increased pain reported  GAIT: Distance walked: >500 ft Assistive device utilized: None Level of assistance: Complete Independence Comments: Patient reports that she starts having pain after 5-10 min  TODAY'S TREATMENT:  03/05/23: Pt arrives for aquatic physical therapy. Treatment took place in 3.5-5.5 feet of water. Water temperature was. Pt entered the pool via   DATE: 02/28/2023       Issued HEP and discussed belly band (with patient able to trial in clinic) Child's pose x20 sec Quadruped slow rocking  x20 Forward lean onto physioball on table with lateral sway x1 min   PATIENT EDUCATION:  Education details: Issued HEP, provided handout on belly band, and provided handout on aquatic PT Person educated: Patient Education method: Explanation, Demonstration, and Handouts Education comprehension: verbalized understanding and returned demonstration  HOME EXERCISE PROGRAM: Access Code: [email or web address] URL: [email or web address] Date: 02/28/2023 Prepared by: Clydie Braun Menke  Exercises - Child's Pose Stretch  - 1 x daily - 7 x weekly - 2 sets - 3 reps - 45s hold - Quadruped Rocking Slow  - 1 x daily - 7 x weekly - 2 sets - 3 reps - 45s hold - Pigeon Pose  - 1 x daily - 7 x weekly - 2 sets - 3 reps - 45s hold - Sidelying Open Book Thoracic Lumbar Rotation and Extension  - 1 x daily - 7 x weekly - 2 sets - 10 reps - Sidelying Reverse Clamshell  - 1 x daily - 7 x weekly - 2 sets - 10 reps - Supine Bilateral Hip Internal Rotation Stretch  - 1 x daily - 7 x weekly - 2 sets - 10 reps - Deep Squat with Pelvic Floor Relaxation  - 1 x daily - 7 x weekly - 2 sets - 10 reps - Seated Hamstring Stretch  - 1 x daily - 7 x weekly - 2 reps - 20 sec hold  ASSESSMENT:  CLINICAL IMPRESSION:  OBJECTIVE IMPAIRMENTS: decreased balance, decreased coordination, difficulty walking, decreased strength, increased muscle spasms, impaired flexibility, postural dysfunction, and pain.   ACTIVITY LIMITATIONS: carrying, lifting, bending, standing, squatting, and bed mobility  PARTICIPATION LIMITATIONS: cleaning, laundry, community activity, and occupation  PERSONAL FACTORS: 1 comorbidity: Pregnancy with estimated due date of 03/27/23  are also affecting patient's functional outcome.   REHAB POTENTIAL: Good  CLINICAL DECISION MAKING: Stable/uncomplicated  EVALUATION COMPLEXITY: Low   GOALS: Goals reviewed with patient? Yes  SHORT TERM GOALS: Target date: 03/14/2023  Patient will be independent with  initial HEP. Baseline: Goal status: INITIAL  2.  Patient will report a 25% improvement in symptoms. Baseline:  Goal status: INITIAL   LONG TERM GOALS: Target date: 03/28/2023  Patient will be independent with advanced HEP to allow for self progression after discharge. Baseline:  Goal status: INITIAL  2.  Patient will improve right hip/knee and trunk strength to [facility] to allow her to safely squat and lift her  daughters. Baseline:  Goal status: INITIAL  3.  Patient will be able to demonstrate lifting 20# from floor without pain to allow her to lift the car seat and pick up her children. Baseline:  Goal status: INITIAL  4.  Patient will report ability to return to walking at the park or neighborhood with her children without increased pain. Baseline:  Goal status: INITIAL   PLAN:  PT FREQUENCY: 2x/week  PT DURATION: 4 weeks  PLANNED INTERVENTIONS: 97164- PT Re-evaluation, 97110-Therapeutic exercises, 97530- Therapeutic activity, 97112- Neuromuscular re-education, 97535- Self Care, 16109- Manual therapy, [phone]- Gait training, [phone]- Orthotic Fit/training, [phone]- Aquatic Therapy, Patient/Family education, Balance training, Stair training, Taping, Dry Needling, Joint mobilization, Joint manipulation, Spinal manipulation, Spinal mobilization, Cryotherapy, and Moist heat.  PLAN FOR NEXT SESSION: Assess and progress HEP as indicated, strengthening, flexibility, manual/dry needling as indicated, Aquatic PT  Ane Payment, PTA 03/04/23 7:09 PM     [facility] [address], [address] [place], [place] [address] Phone # [phone] Fax [phone]

## 2023-03-05 ENCOUNTER — Ambulatory Visit: Payer: BC Managed Care – PPO | Admitting: Physical Therapy

## 2023-03-05 DIAGNOSIS — M549 Dorsalgia, unspecified: Secondary | ICD-10-CM

## 2023-03-05 DIAGNOSIS — R252 Cramp and spasm: Secondary | ICD-10-CM

## 2023-03-05 DIAGNOSIS — R262 Difficulty in walking, not elsewhere classified: Secondary | ICD-10-CM

## 2023-03-05 DIAGNOSIS — M6281 Muscle weakness (generalized): Secondary | ICD-10-CM

## 2023-03-06 ENCOUNTER — Telehealth: Payer: Self-pay | Admitting: Obstetrics

## 2023-03-06 ENCOUNTER — Encounter: Payer: BC Managed Care – PPO | Admitting: Obstetrics

## 2023-03-06 DIAGNOSIS — Z113 Encounter for screening for infections with a predominantly sexual mode of transmission: Secondary | ICD-10-CM

## 2023-03-06 DIAGNOSIS — Z3A36 36 weeks gestation of pregnancy: Secondary | ICD-10-CM

## 2023-03-06 DIAGNOSIS — Z3685 Encounter for antenatal screening for Streptococcus B: Secondary | ICD-10-CM

## 2023-03-06 NOTE — Telephone Encounter (Signed)
 Reached out to pt to reschedule ROB appt that was scheduled on 03/06/2023 at 1:55.  Was able to reschedule the appt to 03/10/2023 at 1:15 with S. Free.

## 2023-03-07 ENCOUNTER — Ambulatory Visit: Payer: BC Managed Care – PPO | Admitting: Rehabilitative and Restorative Service Providers"

## 2023-03-07 ENCOUNTER — Telehealth: Payer: Self-pay | Admitting: Rehabilitative and Restorative Service Providers"

## 2023-03-07 NOTE — Telephone Encounter (Signed)
 Called patient secondary to missed visit.  She apologizes for missing her appointment, but states that she fell at work and had to go to the ED to be assessed.  Patient states that her water did not break, but she was told that she needed to miss work for the next couple of days.  Patient did confirm her next PT appointment at the pool and states that she has the time/date written down and she will be there.

## 2023-03-10 ENCOUNTER — Other Ambulatory Visit (HOSPITAL_COMMUNITY)
Admission: RE | Admit: 2023-03-10 | Discharge: 2023-03-10 | Disposition: A | Discharge status: Home / Self Care | Source: Ambulatory Visit

## 2023-03-10 ENCOUNTER — Ambulatory Visit (INDEPENDENT_AMBULATORY_CARE_PROVIDER_SITE_OTHER): Payer: BC Managed Care – PPO

## 2023-03-10 VITALS — BP 127/71 | HR 78 | Wt 170.0 lb

## 2023-03-10 DIAGNOSIS — D649 Anemia, unspecified: Secondary | ICD-10-CM

## 2023-03-10 DIAGNOSIS — Z3A37 37 weeks gestation of pregnancy: Secondary | ICD-10-CM | POA: Diagnosis not present

## 2023-03-10 DIAGNOSIS — Z3685 Encounter for antenatal screening for Streptococcus B: Secondary | ICD-10-CM | POA: Insufficient documentation

## 2023-03-10 DIAGNOSIS — Z3480 Encounter for supervision of other normal pregnancy, unspecified trimester: Secondary | ICD-10-CM | POA: Diagnosis present

## 2023-03-10 DIAGNOSIS — Z113 Encounter for screening for infections with a predominantly sexual mode of transmission: Secondary | ICD-10-CM | POA: Insufficient documentation

## 2023-03-10 DIAGNOSIS — O99013 Anemia complicating pregnancy, third trimester: Secondary | ICD-10-CM

## 2023-03-10 NOTE — Assessment & Plan Note (Signed)
-   No showed appointment for iron infusion on 2/21. Emphasized importance, she has number to call and reschedule.

## 2023-03-10 NOTE — Assessment & Plan Note (Signed)
-   GBS and GC/CT screening swabs collected today. - Provided with resources for encouraging timely labor.  - Speculum exam negative for pooling, nitrazine negative. Provided reassurance that it does not appear her water has broken and the fluid she experienced yesterday was most likely normal vaginal discharge.  - Reviewed labor warning signs and expectations for birth. Instructed to call office or come to hospital with persistent headache, vision changes, regular contractions, leaking of fluid, decreased fetal movement or vaginal bleeding.

## 2023-03-10 NOTE — Progress Notes (Signed)
    Return Prenatal Note   Assessment/Plan   Plan  23 y.o. [phone] at [redacted]w[redacted]d presents for follow-up OB visit. Reviewed prenatal record including previous visit note.  Supervision of other normal pregnancy, antepartum - GBS and GC/CT screening swabs collected today. - Provided with resources for encouraging timely labor.  - Speculum exam negative for pooling, nitrazine negative. Provided reassurance that it does not appear her water has broken and the fluid she experienced yesterday was most likely normal vaginal discharge.  - Reviewed labor warning signs and expectations for birth. Instructed to call office or come to hospital with persistent headache, vision changes, regular contractions, leaking of fluid, decreased fetal movement or vaginal bleeding.    Anemia affecting pregnancy - No showed appointment for iron infusion on 2/21. Emphasized importance, she has number to call and reschedule.    Orders Placed This Encounter  Procedures   Strep Gp B NAA   Return in about 1 week (around 03/17/2023) for ROB.   Future Appointments  Date Time Provider Department Center  03/12/2023 11:45 AM Manfred Shirts, PTA [facility] None  03/14/2023 11:00 AM Menke, Shaunda, PT [facility] None    For next visit:  continue with routine prenatal care     Subjective   23 y.o. [id number] at [redacted]w[redacted]d presents for this follow-up prenatal visit.  Patient had an episode of fluid that leaked through her underwear and then was present for a few hours last night. She had some contractions after this incident but they have since stopped.  Patient reports: Movement: Present Contractions: Irritability  Objective   Flow sheet Vitals: Pulse Rate: 78 BP: 127/71 Fundal Height: 37 cm Fetal Heart Rate (bpm): 145 Presentation: Vertex Total weight gain: 35 lb (15.9 kg)  General Appearance  No acute distress, well appearing, and well nourished Pulmonary   Normal work of breathing Neurologic   Alert and  oriented to person, place, and time Psychiatric   Mood and affect within normal limits  Lindalou Hose Darneshia Demary, CNM  03/03/252:03 PM

## 2023-03-12 ENCOUNTER — Ambulatory Visit: Payer: BC Managed Care – PPO | Admitting: Physical Therapy

## 2023-03-12 LAB — CERVICOVAGINAL ANCILLARY ONLY
Chlamydia: NEGATIVE
Comment: NEGATIVE
Comment: NORMAL
Neisseria Gonorrhea: NEGATIVE

## 2023-03-12 LAB — STREP GP B NAA: Strep Gp B NAA: NEGATIVE

## 2023-03-12 NOTE — Therapy (Deleted)
 OUTPATIENT PHYSICAL THERAPY THORACOLUMBAR PROGRESS NOTE   Patient Name: Regina Zhang MRN: [id number] DOB:2000-08-05, 23 y.o., female Today's Date: 03/12/2023  END OF SESSION:    Past Medical History:  Diagnosis Date   Anemia affecting pregnancy    Medical history non-contributory    No known health problems    Past Surgical History:  Procedure Laterality Date   NO PAST SURGERIES     Patient Active Problem List   Diagnosis Date Noted   Sciatic pain, right 02/18/2023   Maternal varicella, non-immune 01/01/2023   Anemia affecting pregnancy 01/01/2023   Supervision of other normal pregnancy, antepartum 11/01/2022   Traumatic injury during pregnancy 06/07/2021   Abdominal pain during pregnancy in third trimester 03/09/2020   Back pain affecting pregnancy 01/08/2020    PCP: [facility] at [facility]  REFERRING PROVIDER: Ellwood Sayers, CNM  REFERRING DIAG: [phone] (ICD-10-CM) - Encounter for supervision of normal first pregnancy in third trimester M54.31 (ICD-10-CM) - Sciatic pain, right  Rationale for Evaluation and Treatment: Rehabilitation  THERAPY DIAG:  Back pain during pregnancy  Muscle weakness (generalized)  Cramp and spasm  Difficulty in walking, not elsewhere classified  ONSET DATE: One Month ago  SUBJECTIVE:                                                                                                                                                                                           SUBJECTIVE STATEMENT:  PERTINENT HISTORY:  X3 uncomplicated vaginal deliveries (3 girls), Pregnant with 4th child with estimated due date of 03/27/2023 (a boy)  PAIN:  Are you having pain? Yes: NPRS scale: 4 Pain location: low back pain radiating down right leg Pain description: aching to "super sharp" Aggravating factors: Getting up out of bed and out of chairs Relieving factors: unknown  PRECAUTIONS: Other: Pregnancy  RED FLAGS: None   WEIGHT  BEARING RESTRICTIONS: No  FALLS:  Has patient fallen in last 6 months? 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  NEXT MD VISIT: Routine OB appointments  OBJECTIVE:  Note: Objective measures were completed at Evaluation unless otherwise noted.  DIAGNOSTIC FINDINGS:  Routine ultrasounds  PATIENT SURVEYS:  Modified Oswestry 21 / 50 = 42.0 %   COGNITION: Overall cognitive status: Within functional limits  for tasks assessed     SENSATION: Patient denies.  Patient does report pain with sexual intercourse.  MUSCLE LENGTH: Hamstrings: tightness bilaterally  POSTURE: rounded shoulders and anterior pelvic tilt  PALPATION: Tender to palpation along lumbar paraspinals  LUMBAR ROM:   Painful with right rotation  LOWER EXTREMITY ROM:     WFL  LOWER EXTREMITY MMT:    Eval: Right LE strength grossly 4 to 4+/5 throughout Left LE strength is WFL  LUMBAR SPECIAL TESTS:  Slump test: positive on the right  FUNCTIONAL TESTS:  Eval: 5 times sit to stand: 15.82 sec without increased pain reported  GAIT: Distance walked: >500 ft Assistive device utilized: None Level of assistance: Complete Independence Comments: Patient reports that she starts having pain after 5-10 min  TODAY'S TREATMENT:  03/12/23: Pt arrives for aquatic physical therapy. Treatment took place in 3.5-5.5 feet of water. Water temperature was. Pt entered the pool via   DATE: 02/28/2023       Issued HEP and discussed belly band (with patient able to trial in clinic) Child's pose x20 sec Quadruped slow rocking  x20 Forward lean onto physioball on table with lateral sway x1 min   PATIENT EDUCATION:  Education details: Issued HEP, provided handout on belly band, and provided handout on aquatic PT Person educated: Patient Education method: Explanation, Demonstration, and Handouts Education comprehension: verbalized understanding and returned demonstration  HOME EXERCISE PROGRAM: Access Code: [email or web address] URL: [email or web address] Date: 02/28/2023 Prepared by: Clydie Braun Menke  Exercises - Child's Pose Stretch  - 1 x daily - 7 x weekly - 2 sets - 3 reps - 45s hold - Quadruped Rocking Slow  - 1 x daily - 7 x weekly - 2 sets - 3 reps - 45s hold - Pigeon Pose  - 1 x daily - 7 x weekly - 2 sets - 3 reps - 45s hold - Sidelying Open Book Thoracic Lumbar Rotation and Extension  - 1 x daily - 7 x weekly - 2 sets - 10 reps - Sidelying Reverse Clamshell  - 1 x daily - 7 x weekly - 2 sets - 10 reps - Supine Bilateral Hip Internal Rotation Stretch  - 1 x daily - 7 x weekly - 2 sets - 10 reps - Deep Squat with Pelvic Floor Relaxation  - 1 x daily - 7 x weekly - 2 sets - 10 reps - Seated Hamstring Stretch  - 1 x daily - 7 x weekly - 2 reps - 20 sec hold  ASSESSMENT:  CLINICAL IMPRESSION:  OBJECTIVE IMPAIRMENTS: decreased balance, decreased coordination, difficulty walking, decreased strength, increased muscle spasms, impaired flexibility, postural dysfunction, and pain.   ACTIVITY LIMITATIONS: carrying, lifting, bending, standing, squatting, and bed mobility  PARTICIPATION LIMITATIONS: cleaning, laundry, community activity, and occupation  PERSONAL FACTORS: 1 comorbidity: Pregnancy with estimated due date of 03/27/23  are also affecting patient's functional outcome.   REHAB POTENTIAL: Good  CLINICAL DECISION MAKING: Stable/uncomplicated  EVALUATION COMPLEXITY: Low   GOALS: Goals reviewed with patient? Yes  SHORT TERM GOALS: Target date: 03/14/2023  Patient will be independent with  initial HEP. Baseline: Goal status: INITIAL  2.  Patient will report a 25% improvement in symptoms. Baseline:  Goal status: INITIAL   LONG TERM GOALS: Target date: 03/28/2023  Patient will be independent with advanced HEP to allow for self progression after discharge. Baseline:  Goal status: INITIAL  2.  Patient will improve right hip/knee and trunk strength to [facility] to allow her to safely squat and lift her  daughters. Baseline:  Goal status: INITIAL  3.  Patient will be able to demonstrate lifting 20# from floor without pain to allow her to lift the car seat and pick up her children. Baseline:  Goal status: INITIAL  4.  Patient will report ability to return to walking at the park or neighborhood with her children without increased pain. Baseline:  Goal status: INITIAL   PLAN:  PT FREQUENCY: 2x/week  PT DURATION: 4 weeks  PLANNED INTERVENTIONS: 97164- PT Re-evaluation, 97110-Therapeutic exercises, 97530- Therapeutic activity, 97112- Neuromuscular re-education, 97535- Self Care, 16109- Manual therapy, [phone]- Gait training, [phone]- Orthotic Fit/training, [phone]- Aquatic Therapy, Patient/Family education, Balance training, Stair training, Taping, Dry Needling, Joint mobilization, Joint manipulation, Spinal manipulation, Spinal mobilization, Cryotherapy, and Moist heat.  PLAN FOR NEXT SESSION: Assess and progress HEP as indicated, strengthening, flexibility, manual/dry needling as indicated, Aquatic PT  Ane Payment, PTA 03/12/23 8:18 AM     [facility] [address], [address] [place], [place] [address] Phone # [phone] Fax [phone]

## 2023-03-14 ENCOUNTER — Telehealth: Payer: Self-pay

## 2023-03-14 ENCOUNTER — Ambulatory Visit
Payer: BC Managed Care – PPO | Attending: Licensed Practical Nurse | Admitting: Rehabilitative and Restorative Service Providers"

## 2023-03-14 DIAGNOSIS — O99891 Other specified diseases and conditions complicating pregnancy: Secondary | ICD-10-CM | POA: Insufficient documentation

## 2023-03-14 DIAGNOSIS — Z3A36 36 weeks gestation of pregnancy: Secondary | ICD-10-CM | POA: Insufficient documentation

## 2023-03-14 DIAGNOSIS — M545 Low back pain, unspecified: Secondary | ICD-10-CM | POA: Insufficient documentation

## 2023-03-14 DIAGNOSIS — R262 Difficulty in walking, not elsewhere classified: Secondary | ICD-10-CM | POA: Insufficient documentation

## 2023-03-14 DIAGNOSIS — R252 Cramp and spasm: Secondary | ICD-10-CM | POA: Insufficient documentation

## 2023-03-14 NOTE — Telephone Encounter (Signed)
 Marland Kitchen

## 2023-03-14 NOTE — Telephone Encounter (Signed)
 Left voicemail to return call. Will send my chart message.

## 2023-03-16 ENCOUNTER — Inpatient Hospital Stay (HOSPITAL_COMMUNITY)
Admission: AD | Admit: 2023-03-16 | Discharge: 2023-03-16 | Disposition: A | Discharge status: Home / Self Care | Attending: Obstetrics & Gynecology | Admitting: Obstetrics & Gynecology

## 2023-03-16 ENCOUNTER — Encounter (HOSPITAL_COMMUNITY): Payer: Self-pay | Admitting: Obstetrics & Gynecology

## 2023-03-16 ENCOUNTER — Inpatient Hospital Stay (HOSPITAL_COMMUNITY)

## 2023-03-16 ENCOUNTER — Other Ambulatory Visit: Payer: Self-pay

## 2023-03-16 DIAGNOSIS — K5900 Constipation, unspecified: Secondary | ICD-10-CM | POA: Insufficient documentation

## 2023-03-16 LAB — URINALYSIS, ROUTINE W REFLEX MICROSCOPIC
Bilirubin Urine: NEGATIVE
Glucose, UA: NEGATIVE mg/dL
Ketones, ur: NEGATIVE mg/dL
Nitrite: NEGATIVE
Protein, ur: NEGATIVE mg/dL
RBC / HPF: >50 RBC/hpf (ref 0–5)
Specific Gravity, Urine: 1.012 (ref 1.005–1.030)
pH: 8 (ref 5.0–8.0)

## 2023-03-16 MED ORDER — DICYCLOMINE HCL 20 MG PO TABS
20.0000 mg | ORAL_TABLET | Freq: Once | ORAL | Status: AC
  Administered 2023-03-16: 20 mg via ORAL
  Filled 2023-03-16: qty 1

## 2023-03-16 MED ORDER — LACTATED RINGERS IV BOLUS: 1000.0000 mL | Freq: Once | INTRAVENOUS | Status: DC

## 2023-03-16 MED ORDER — SMOG ENEMA
960.0000 mL | Freq: Once | RECTAL | Status: AC
  Administered 2023-03-16: 960 mL via RECTAL
  Filled 2023-03-16: qty 960

## 2023-03-16 MED ORDER — MILK AND MOLASSES ENEMA
1.0000 | Freq: Once | RECTAL | Status: DC
  Filled 2023-03-16: qty 240

## 2023-03-16 MED ORDER — POLYETHYLENE GLYCOL 3350 17 G PO PACK: 17.0000 g | PACK | Freq: Two times a day (BID) | ORAL | Status: DC

## 2023-03-16 MED ORDER — POLYETHYLENE GLYCOL 3350 17 G PO PACK: 17.0000 g | PACK | Freq: Two times a day (BID) | ORAL | 0 refills | Status: AC

## 2023-03-16 NOTE — MAU Provider Note (Signed)
 MAU Provider Note  Chief Complaint: Constipation  SUBJECTIVE HPI: Regina Zhang is a 23 y.o. [phone] postpartum day 2 after uncomplicated SVD at 38 weeks who presents to maternity admissions reporting inability to pass stool x6 days with associated left sided 9/10 stomach pain with radiation down her left leg.   Lochia moderate. Baby doing well.  On PPD#1 she was meeting all goals and deemed stable for discharge. She will be seen at [facility] for postpartum care.   Pregnancy c/b anemia s/p iron infusion on oral supplementation. Receives [facility] with [facility] in [place].   HPI  Past Medical History:  Diagnosis Date   Anemia affecting pregnancy    Medical history non-contributory    No known health problems    Past Surgical History:  Procedure Laterality Date   NO PAST SURGERIES     Social History   Socioeconomic History   Marital status: Single    Spouse name: Not on file   Number of children: Not on file   Years of education: Not on file   Highest education level: High school graduate  Occupational History   Occupation: [employer]  Tobacco Use   Smoking status: Never   Smokeless tobacco: Never  Vaping Use   Vaping status: Never Used  Substance and Sexual Activity   Alcohol use: Yes    Comment: occ   Drug use: No   Sexual activity: Yes    Birth control/protection: None  Other Topics Concern   Not on file  Social History Narrative   Not on file   Social Drivers of Health   Financial Resource Strain: Not on file  Food Insecurity: Not on file  Transportation Needs: Not on file  Physical Activity: Not on file  Stress: Not on file  Social Connections: Unknown (08/16/2021)   Received from [facility], [facility]   Social Network    Social Network: Not on file  Intimate Partner Violence: Unknown (08/16/2021)   Received from [facility], [facility]   HITS    Physically Hurt: Not on file    Insult or Talk Down To: Not on file    Threaten  Physical Harm: Not on file    Scream or Curse: Not on file   No current facility-administered medications on file prior to encounter.   Current Outpatient Medications on File Prior to Encounter  Medication Sig Dispense Refill   ferrous sulfate 325 (65 FE) MG tablet Take 325 mg by mouth every other day.     Prenatal Vit-Fe Fumarate-FA (MULTIVITAMIN-PRENATAL) 27-0.8 MG TABS tablet Take 1 tablet by mouth daily at 12 noon.     No Known Allergies  ROS:  Pertinent positives/negatives listed above.  I have reviewed patient's Past Medical Hx, Surgical Hx, Family Hx, Social Hx, medications and allergies.   Physical Exam  Patient Vitals for the past 24 hrs:  BP Temp Temp src Pulse Resp SpO2  03/16/23 1647 (!) 112/58 98.2 F (36.8 C) Oral 94 16 100 %   Constitutional: Well-developed, well-nourished female in no acute distress  Cardiovascular: normal rate Respiratory: normal effort GI: Abd soft, non-tender MS: Extremities nontender, no edema, normal ROM Neurologic: Alert and oriented x 4  GU: Neg CVAT.  LAB RESULTS Results for orders placed or performed during the hospital encounter of 03/16/23 (from the past 24 hours)  Urinalysis, Routine w reflex microscopic -Urine, Clean Catch     Status: Abnormal   Collection Time: 03/16/23  4:42 PM  Result Value Ref  Range   Color, Urine YELLOW YELLOW   APPearance CLOUDY (A) CLEAR   Specific Gravity, Urine 1.012 1.005 - 1.030   pH 8.0 5.0 - 8.0   Glucose, UA NEGATIVE NEGATIVE mg/dL   Hgb urine dipstick MODERATE (A) NEGATIVE   Bilirubin Urine NEGATIVE NEGATIVE   Ketones, ur NEGATIVE NEGATIVE mg/dL   Protein, ur NEGATIVE NEGATIVE mg/dL   Nitrite NEGATIVE NEGATIVE   Leukocytes,Ua TRACE (A) NEGATIVE   RBC / HPF >50 0 - 5 RBC/hpf   WBC, UA 21-50 0 - 5 WBC/hpf   Bacteria, UA RARE (A) NONE SEEN   Squamous Epithelial / HPF 0-5 0 - 5 /HPF   Mucus PRESENT     O/Positive/-- (10/22 1052)  IMAGING DG Abd 1 View Result Date: 03/16/2023 CLINICAL  DATA:  [id number] Constipation [id number] Postpartum state [id number] [phone] Abdominal pain [id number] EXAM: ABDOMEN - 1 VIEW COMPARISON:  CT abdomen pelvis 07/02/2021 FINDINGS: The bowel gas pattern is normal. Stool throughout the visualized ascending colon. No radio-opaque calculi or other significant radiographic abnormality are seen. IMPRESSION: Nonobstructive bowel gas pattern. Electronically Signed   By: Tish Frederickson M.D.   On: 03/16/2023 20:06    MAU Management/MDM: Orders Placed This Encounter  Procedures   DG Abd 1 View   Urinalysis, Routine w reflex microscopic -Urine, Clean Catch   Discharge patient Discharge disposition: 01-Home or Self Care; Discharge patient date: 03/16/2023    Meds ordered this encounter  Medications   DISCONTD: milk and molasses enema   DISCONTD: polyethylene glycol (MIRALAX / GLYCOLAX) packet 17 g   polyethylene glycol (MIRALAX / GLYCOLAX) 17 g packet    Sig: Take 17 g by mouth 2 (two) times daily.    Dispense:  14 each    Refill:  0   sorbitol, magnesium hydroxide, mineral oil, glycerin (SMOG) enema   dicyclomine (BENTYL) tablet 20 mg   DISCONTD: lactated ringers bolus 1,000 mL     Available prenatal records reviewed.  ASSESSMENT 1. Postpartum state   2. Constipation, unspecified constipation type   No bowel movement for 6 days despite Miralax daily. Discussed role for enema vs suppository. Patient prefers enema.  - Trial of enema without improvement - Trial of Bentyl with improvement - XR consistent with constipation   PLAN Discharge home with strict return precautions. Continue routine postpartum care as scheduled Fiber rich diet Adequate hydration Miralax titration to achieve one soft easy stool every 24-48 hours  Allergies as of 03/16/2023   No Known Allergies      Medication List     TAKE these medications    ferrous sulfate 325 (65 FE) MG tablet Take 325 mg by mouth every other day.   multivitamin-prenatal 27-0.8 MG Tabs  tablet Take 1 tablet by mouth daily at 12 noon.   polyethylene glycol 17 g packet Commonly known as: MIRALAX / GLYCOLAX Take 17 g by mouth 2 (two) times daily.         Follow-up Information     [facility] Follow up.   Specialty: Obstetrics and Gynecology Why: As needed for post partum emergencies Contact information: [address] [place] [address] [phone]               Wyn Forster, MD FMOB Fellow, Faculty practice [facility], [facility]  03/16/2023  8:27 PM

## 2023-03-16 NOTE — MAU Note (Signed)
.  Regina Zhang is a 23 y.o. at [redacted]w[redacted]d here in MAU reporting: Patient reports she has not been able to poop in the last 6 days. Patient reports this morning she was having sharp stomach left sided stomach pain that shoots down her left leg  Onset of complaint: 6 days ago  Pain score: 9/10 There were no vitals filed for this visit.    Lab orders placed from triage:   ua

## 2023-03-17 ENCOUNTER — Encounter: Admitting: Certified Nurse Midwife

## 2023-03-18 ENCOUNTER — Inpatient Hospital Stay (HOSPITAL_COMMUNITY)

## 2023-03-18 ENCOUNTER — Encounter (HOSPITAL_COMMUNITY): Payer: Self-pay | Admitting: Obstetrics and Gynecology

## 2023-03-18 ENCOUNTER — Inpatient Hospital Stay (HOSPITAL_COMMUNITY)
Admission: AD | Admit: 2023-03-18 | Discharge: 2023-03-18 | Disposition: A | Discharge status: Home / Self Care | Attending: Obstetrics & Gynecology | Admitting: Obstetrics & Gynecology

## 2023-03-18 DIAGNOSIS — N719 Inflammatory disease of uterus, unspecified: Secondary | ICD-10-CM

## 2023-03-18 DIAGNOSIS — O8612 Endometritis following delivery: Secondary | ICD-10-CM | POA: Diagnosis not present

## 2023-03-18 DIAGNOSIS — O99893 Other specified diseases and conditions complicating puerperium: Secondary | ICD-10-CM | POA: Insufficient documentation

## 2023-03-18 LAB — COMPREHENSIVE METABOLIC PANEL
ALT: 19 U/L (ref 0–44)
AST: 32 U/L (ref 15–41)
Albumin: 2.5 g/dL — ABNORMAL LOW (ref 3.5–5.0)
Alkaline Phosphatase: 101 U/L (ref 38–126)
Anion gap: 9 (ref 5–15)
BUN: 8 mg/dL (ref 6–20)
CO2: 19 mmol/L — ABNORMAL LOW (ref 22–32)
Calcium: 8.5 mg/dL — ABNORMAL LOW (ref 8.9–10.3)
Chloride: 107 mmol/L (ref 98–111)
Creatinine, Ser: 0.74 mg/dL (ref 0.44–1.00)
GFR, Estimated: >60 mL/min (ref >60)
Glucose, Bld: 86 mg/dL (ref 70–99)
Potassium: 3.9 mmol/L (ref 3.5–5.1)
Sodium: 135 mmol/L (ref 135–145)
Total Bilirubin: 0.6 mg/dL (ref 0.0–1.2)
Total Protein: 6.3 g/dL — ABNORMAL LOW (ref 6.5–8.1)

## 2023-03-18 LAB — LIPASE, BLOOD: Lipase: 28 U/L (ref 11–51)

## 2023-03-18 LAB — URINALYSIS, ROUTINE W REFLEX MICROSCOPIC
Bacteria, UA: NONE SEEN
Bilirubin Urine: NEGATIVE
Glucose, UA: NEGATIVE mg/dL
Ketones, ur: NEGATIVE mg/dL
Nitrite: NEGATIVE
Protein, ur: 30 mg/dL — AB
RBC / HPF: >50 RBC/hpf (ref 0–5)
Specific Gravity, Urine: 1.016 (ref 1.005–1.030)
WBC, UA: >50 WBC/hpf (ref 0–5)
pH: 7 (ref 5.0–8.0)

## 2023-03-18 LAB — CBC
HCT: 31.7 % — ABNORMAL LOW (ref 36.0–46.0)
Hemoglobin: 9.6 g/dL — ABNORMAL LOW (ref 12.0–15.0)
MCH: 23.7 pg — ABNORMAL LOW (ref 26.0–34.0)
MCHC: 30.3 g/dL (ref 30.0–36.0)
MCV: 78.3 fL — ABNORMAL LOW (ref 80.0–100.0)
Platelets: 252 10*3/uL (ref 150–400)
RBC: 4.05 MIL/uL (ref 3.87–5.11)
RDW: 17.3 % — ABNORMAL HIGH (ref 11.5–15.5)
WBC: 7.1 10*3/uL (ref 4.0–10.5)
nRBC: 0 % (ref 0.0–0.2)

## 2023-03-18 LAB — AMYLASE: Amylase: 60 U/L (ref 28–100)

## 2023-03-18 MED ORDER — KETOROLAC TROMETHAMINE 30 MG/ML IJ SOLN
30.0000 mg | Freq: Once | INTRAMUSCULAR | Status: AC
  Administered 2023-03-18: 30 mg via INTRAVENOUS
  Filled 2023-03-18: qty 1

## 2023-03-18 MED ORDER — CYCLOBENZAPRINE HCL 5 MG PO TABS
5.0000 mg | ORAL_TABLET | Freq: Three times a day (TID) | ORAL | Status: DC | PRN
  Administered 2023-03-18: 5 mg via ORAL
  Filled 2023-03-18: qty 1

## 2023-03-18 MED ORDER — DICYCLOMINE HCL 20 MG PO TABS
20.0000 mg | ORAL_TABLET | Freq: Once | ORAL | Status: AC
  Administered 2023-03-18: 20 mg via ORAL
  Filled 2023-03-18: qty 1

## 2023-03-18 MED ORDER — IOHEXOL 350 MG/ML SOLN
75.0000 mL | Freq: Once | INTRAVENOUS | Status: AC | PRN
  Administered 2023-03-18: 75 mL via INTRAVENOUS

## 2023-03-18 MED ORDER — AMOXICILLIN-POT CLAVULANATE 875-125 MG PO TABS
1.0000 | ORAL_TABLET | Freq: Two times a day (BID) | ORAL | Status: DC
  Administered 2023-03-18: 1 via ORAL
  Filled 2023-03-18: qty 1

## 2023-03-18 MED ORDER — SMOG ENEMA
400.0000 mL | Freq: Once | RECTAL | Status: AC
Start: 2023-03-18 — End: 2023-03-18
  Administered 2023-03-18: 400 mL via RECTAL
  Filled 2023-03-18: qty 960

## 2023-03-18 MED ORDER — AMOXICILLIN-POT CLAVULANATE 875-125 MG PO TABS: 1.0000 | ORAL_TABLET | Freq: Two times a day (BID) | ORAL | 0 refills | Status: AC

## 2023-03-18 MED ORDER — IOHEXOL 9 MG/ML PO SOLN
1000.0000 mL | ORAL | Status: AC
  Administered 2023-03-18: 1000 mL via ORAL

## 2023-03-18 NOTE — MAU Note (Signed)
..  Regina Zhang is a 23 y.o. pp vaginal delivery at [facility] on 03/14/23 here in MAU reporting: unable to have a bowel movement in x1 week. States that her back pain and abdominal pain have increased since she was here last. Also reports n/v and a fever yesterday, but denies today.   Pain score: 10 Vitals:   03/18/23 1219  BP: 117/61  Pulse: 80  Resp: 15  Temp: 98.6 F (37 C)  SpO2: 100%      Lab orders placed from triage:   UA

## 2023-03-18 NOTE — MAU Provider Note (Signed)
 S Ms. Regina Zhang is a 23 y.o. [phone] pregnant/non-pregnant female at Unknown who presents to MAU today with complaint of unable to stool for 8 days despite enema, miralax. Now 4 days post partum. Reports temperature of 100.4F yesterday.   Pregnancy c/b anemia s/p iron infusion on oral supplementation. Receives [facility] with [facility] in [place].  Prenatal records reviewed.  Pertinent items noted in HPI and remainder of comprehensive ROS otherwise negative.   O BP 117/61   Pulse 80   Temp 98.6 F (37 C) (Oral)   Resp 15   SpO2 100%  Physical Exam Constitutional:      General: She is in acute distress.     Appearance: She is well-developed.  HENT:     Head: Normocephalic and atraumatic.  Cardiovascular:     Rate and Rhythm: Normal rate and regular rhythm.  Pulmonary:     Effort: Pulmonary effort is normal.     Breath sounds: Normal breath sounds.  Abdominal:     General: Abdomen is flat. Bowel sounds are decreased. There is no distension or abdominal bruit.     Tenderness: There is abdominal tenderness in the epigastric area, periumbilical area and suprapubic area. There is rebound. There is no guarding. Negative signs include McBurney's sign.  Genitourinary:    Vagina: Vaginal discharge and bleeding present.     Uterus: Enlarged and tender.      Comments: Uterus 4 below umbilicus, firm.  Skin:    General: Skin is warm and dry.     Capillary Refill: Capillary refill takes less than 2 seconds.  Neurological:     General: No focal deficit present.     Mental Status: She is alert.  Psychiatric:     Comments: Tired    Results for orders placed or performed during the hospital encounter of 03/18/23 (from the past 24 hours)  Urinalysis, Routine w reflex microscopic -Urine, Clean Catch     Status: Abnormal   Collection Time: 03/18/23 12:29 PM  Result Value Ref Range   Color, Urine YELLOW YELLOW   APPearance HAZY (A) CLEAR   Specific Gravity, Urine 1.016 1.005 -  1.030   pH 7.0 5.0 - 8.0   Glucose, UA NEGATIVE NEGATIVE mg/dL   Hgb urine dipstick LARGE (A) NEGATIVE   Bilirubin Urine NEGATIVE NEGATIVE   Ketones, ur NEGATIVE NEGATIVE mg/dL   Protein, ur 30 (A) NEGATIVE mg/dL   Nitrite NEGATIVE NEGATIVE   Leukocytes,Ua MODERATE (A) NEGATIVE   RBC / HPF >50 0 - 5 RBC/hpf   WBC, UA >50 0 - 5 WBC/hpf   Bacteria, UA NONE SEEN NONE SEEN   Squamous Epithelial / HPF 0-5 0 - 5 /HPF   Mucus PRESENT   CBC     Status: Abnormal   Collection Time: 03/18/23 12:48 PM  Result Value Ref Range   WBC 7.1 4.0 - 10.5 K/uL   RBC 4.05 3.87 - 5.11 MIL/uL   Hemoglobin 9.6 (L) 12.0 - 15.0 g/dL   HCT 45.4 (L) 09.8 - 11.9 %   MCV 78.3 (L) 80.0 - 100.0 fL   MCH 23.7 (L) 26.0 - 34.0 pg   MCHC 30.3 30.0 - 36.0 g/dL   RDW 14.7 (H) 82.9 - 56.2 %   Platelets 252 150 - 400 K/uL   nRBC 0.0 0.0 - 0.2 %  Comprehensive metabolic panel     Status: Abnormal   Collection Time: 03/18/23 12:48 PM  Result Value Ref Range   Sodium 135 135 -  145 mmol/L   Potassium 3.9 3.5 - 5.1 mmol/L   Chloride 107 98 - 111 mmol/L   CO2 19 (L) 22 - 32 mmol/L   Glucose, Bld 86 70 - 99 mg/dL   BUN 8 6 - 20 mg/dL   Creatinine, Ser 4.09 0.44 - 1.00 mg/dL   Calcium 8.5 (L) 8.9 - 10.3 mg/dL   Total Protein 6.3 (L) 6.5 - 8.1 g/dL   Albumin 2.5 (L) 3.5 - 5.0 g/dL   AST 32 15 - 41 U/L   ALT 19 0 - 44 U/L   Alkaline Phosphatase 101 38 - 126 U/L   Total Bilirubin 0.6 0.0 - 1.2 mg/dL   GFR, Estimated >81 >19 mL/min   Anion gap 9 5 - 15  Amylase     Status: None   Collection Time: 03/18/23 12:48 PM  Result Value Ref Range   Amylase 60 28 - 100 U/L  Lipase, blood     Status: None   Collection Time: 03/18/23 12:48 PM  Result Value Ref Range   Lipase 28 11 - 51 U/L    MDM: MAU Course: Orders Placed This Encounter  Procedures   Urine Culture (for pregnant, neutropenic or urologic patients or patients with an indwelling urinary catheter)    Standing Status:   Standing    Number of Occurrences:   1    CT ABDOMEN PELVIS W CONTRAST    Standing Status:   Standing    Number of Occurrences:   1    Does the patient have a contrast media/X-ray dye allergy?:   No    If indicated for the ordered procedure, I authorize the administration of contrast media per Radiology protocol:   Yes    If indicated for the ordered procedure, I authorize the administration of oral contrast media per Radiology protocol:   Yes    Call Results- Best Contact Number?:   [phone]   Urinalysis, Routine w reflex microscopic -Urine, Clean Catch    Standing Status:   Standing    Number of Occurrences:   1    Specimen Source:   Urine, Clean Catch [[id number]]   CBC    Standing Status:   Standing    Number of Occurrences:   1   Comprehensive metabolic panel    Standing Status:   Standing    Number of Occurrences:   1   Amylase    Standing Status:   Standing    Number of Occurrences:   1   Lipase, blood    Standing Status:   Standing    Number of Occurrences:   1   Discharge patient Discharge disposition: 01-Home or Self Care; Discharge patient date: 03/18/2023    Standing Status:   Standing    Number of Occurrences:   1    Discharge disposition:   01-Home or Self Care [1]    Discharge patient date:   03/18/2023   Study Result  Narrative & Impression  CLINICAL DATA:  Bowel obstruction suspected. Back pain and abdominal pain.   EXAM: CT ABDOMEN AND PELVIS WITH CONTRAST   TECHNIQUE: Multidetector CT imaging of the abdomen and pelvis was performed using the standard protocol following bolus administration of intravenous contrast.   RADIATION DOSE REDUCTION: This exam was performed according to the departmental dose-optimization program which includes automated exposure control, adjustment of the mA and/or kV according to patient size and/or use of iterative reconstruction technique.   CONTRAST:  75mL OMNIPAQUE IOHEXOL 350 MG/ML SOLN  COMPARISON:  CT scan abdomen and pelvis from 07/02/2021.    FINDINGS: Lower chest: The lung bases are clear. No pleural effusion. The heart is normal in size. No pericardial effusion.   Hepatobiliary: The liver is normal in size. Non-cirrhotic configuration. No suspicious mass. No intrahepatic or extrahepatic bile duct dilation. No calcified gallstones. Normal gallbladder wall thickness. No pericholecystic inflammatory changes.   Pancreas: Unremarkable. No pancreatic ductal dilatation or surrounding inflammatory changes.   Spleen: Within normal limits. No focal lesion.   Adrenals/Urinary Tract: Adrenal glands are unremarkable. No suspicious renal mass. No hydronephrosis. No renal or ureteric calculi. Unremarkable urinary bladder.   Stomach/Bowel: No disproportionate dilation of the small or large bowel loops. No evidence of abnormal bowel wall thickening or inflammatory changes. The appendix is unremarkable.   Vascular/Lymphatic: No ascites or pneumoperitoneum. No abdominal or pelvic lymphadenopathy, by size criteria. No aneurysmal dilation of the major abdominal arteries.   Reproductive: Markedly enlarged/bulky uterus with thickened endometrium. Correlate for recent pregnancy. No large adnexal mass.   Other: There is a tiny fat containing umbilical hernia. The soft tissues and abdominal wall are otherwise unremarkable.   Musculoskeletal: No suspicious osseous lesions.   IMPRESSION: 1. No acute inflammatory process identified within the abdomen or pelvis. No bowel obstruction. 2. Markedly enlarged/bulky uterus with thickened endometrium. Correlate for recent pregnancy. 3. Multiple other nonacute observations, as described above.     Electronically Signed   By: Jules Schick M.D.   On: 03/18/2023 18:46    A Postpartum state  Endometritis  Medical screening exam complete  This patient complains of migratory abdominal pain; this involves an extensive number of treatment Options and is a complaint that carries with it a  high risk of complications and morbidity. The differential includes diverticulitis, colitis, UTI, renal colic, endometritis, constipation   I ordered, reviewed and interpreted labs, which included CBC with normal white count, stable hemoglobin, chemistries with mildly low bicarb fairly normal LFTs, urinalysis without signs of infection I ordered medication IV pain medication fluids, enema with improvement in her symptoms and reviewed PMP when indicated. I ordered imaging studies which included CT abdomen and pelvis and I independently    visualized and interpreted imaging which showed no clear findings to explain patient's pain Reviewed discharge summary from delivery, and prior history of similar symptoms after second delivery   Social determinants considered, no significant barriers Critical Interventions: None  Admission and further testing considered, no indications for admission or further work-up at this time.  Counseled the patient to pump and dump breastmilk.  Follow-up with her OB team.  Will cover with antibiotics for possible endometritis.  Return instructions discussed  P Discharge from MAU in stable condition with return precautions Follow up at [facility] at [place] as scheduled for ongoing prenatal care  No future appointments. Allergies as of 03/18/2023   No Known Allergies      Medication List     TAKE these medications    amoxicillin-clavulanate 875-125 MG tablet Commonly known as: AUGMENTIN Take 1 tablet by mouth every 12 (twelve) hours for 7 days.   ferrous sulfate 325 (65 FE) MG tablet Take 325 mg by mouth every other day.   multivitamin-prenatal 27-0.8 MG Tabs tablet Take 1 tablet by mouth daily at 12 noon.   polyethylene glycol 17 g packet Commonly known as: MIRALAX / GLYCOLAX Take 17 g by mouth 2 (two) times daily.        Wyn Forster, MD 03/18/2023 7:34 PM

## 2023-03-19 LAB — URINE CULTURE: Culture: NO GROWTH

## 2023-03-24 ENCOUNTER — Encounter: Admitting: Certified Nurse Midwife

## 2023-03-31 ENCOUNTER — Encounter: Admitting: Obstetrics

## 2023-04-24 ENCOUNTER — Ambulatory Visit

## 2023-04-24 ENCOUNTER — Telehealth: Payer: Self-pay

## 2023-04-24 NOTE — Telephone Encounter (Signed)
 Reached out to pt to reschedule 6 week pp visit that was scheduled on 417/2025 at 3:15 with S. Free.  Was able to reschedule the appt to 4/23 at 9:55 with S. Free.

## 2023-04-30 ENCOUNTER — Telehealth: Payer: Self-pay

## 2023-04-30 ENCOUNTER — Ambulatory Visit

## 2023-04-30 NOTE — Telephone Encounter (Signed)
 Reached out to pt to reschedule 6 week pp visit that was scheduled on 04/30/2023 at 9:55 with S. Free.  Left message for pt to call back .

## 2023-05-01 NOTE — Telephone Encounter (Signed)
 Reached out to pt (2x) to reschedule 6 week pp visit that was scheduled on 04/30/2023 at 9:55 with S. Free.  Left message for pt to call back.  Will send a MyChart letter to pt.
